# Patient Record
Sex: Male | Born: 1965 | Race: White | Hispanic: No | Marital: Married | Smoking: Never smoker
Health system: Southern US, Community
[De-identification: ages and names within clinical notes are randomized; demographics above are authoritative.]

## PROBLEM LIST (undated history)

## (undated) DIAGNOSIS — W3400XA Accidental discharge from unspecified firearms or gun, initial encounter: Secondary | ICD-10-CM

## (undated) DIAGNOSIS — N200 Calculus of kidney: Secondary | ICD-10-CM

## (undated) DIAGNOSIS — M109 Gout, unspecified: Secondary | ICD-10-CM

## (undated) DIAGNOSIS — M1990 Unspecified osteoarthritis, unspecified site: Secondary | ICD-10-CM

## (undated) DIAGNOSIS — E119 Type 2 diabetes mellitus without complications: Secondary | ICD-10-CM

## (undated) DIAGNOSIS — E781 Pure hyperglyceridemia: Secondary | ICD-10-CM

## (undated) HISTORY — DX: Pure hyperglyceridemia: E78.1

## (undated) HISTORY — DX: Type 2 diabetes mellitus without complications: E11.9

## (undated) HISTORY — DX: Accidental discharge from unspecified firearms or gun, initial encounter: W34.00XA

## (undated) HISTORY — PX: SPLENECTOMY: SUR1306

## (undated) HISTORY — DX: Gout, unspecified: M10.9

## (undated) HISTORY — DX: Calculus of kidney: N20.0

---

## 1998-02-25 ENCOUNTER — Ambulatory Visit (HOSPITAL_BASED_OUTPATIENT_CLINIC_OR_DEPARTMENT_OTHER): Admission: RE | Admit: 1998-02-25 | Discharge: 1998-02-25 | Payer: Self-pay

## 2002-02-20 ENCOUNTER — Emergency Department (HOSPITAL_COMMUNITY): Admission: EM | Admit: 2002-02-20 | Discharge: 2002-02-20 | Payer: Self-pay

## 2002-03-13 ENCOUNTER — Encounter: Payer: Self-pay | Admitting: Family Medicine

## 2002-03-13 ENCOUNTER — Ambulatory Visit (HOSPITAL_COMMUNITY): Admission: RE | Admit: 2002-03-13 | Discharge: 2002-03-13 | Payer: Self-pay | Admitting: Family Medicine

## 2002-04-28 ENCOUNTER — Ambulatory Visit (HOSPITAL_BASED_OUTPATIENT_CLINIC_OR_DEPARTMENT_OTHER): Admission: RE | Admit: 2002-04-28 | Discharge: 2002-04-28 | Payer: Self-pay | Admitting: General Surgery

## 2005-04-20 ENCOUNTER — Ambulatory Visit (HOSPITAL_BASED_OUTPATIENT_CLINIC_OR_DEPARTMENT_OTHER): Admission: RE | Admit: 2005-04-20 | Discharge: 2005-04-20 | Payer: Self-pay | Admitting: General Surgery

## 2005-04-20 ENCOUNTER — Ambulatory Visit (HOSPITAL_COMMUNITY): Admission: RE | Admit: 2005-04-20 | Discharge: 2005-04-20 | Payer: Self-pay | Admitting: General Surgery

## 2005-11-04 ENCOUNTER — Ambulatory Visit (HOSPITAL_COMMUNITY): Admission: RE | Admit: 2005-11-04 | Discharge: 2005-11-04 | Payer: Self-pay | Admitting: Family Medicine

## 2006-01-16 ENCOUNTER — Emergency Department (HOSPITAL_COMMUNITY): Admission: EM | Admit: 2006-01-16 | Discharge: 2006-01-16 | Payer: Self-pay | Admitting: Emergency Medicine

## 2012-12-04 ENCOUNTER — Encounter: Payer: Self-pay | Admitting: Internal Medicine

## 2013-02-04 ENCOUNTER — Ambulatory Visit: Payer: Self-pay | Admitting: Internal Medicine

## 2014-01-16 ENCOUNTER — Other Ambulatory Visit: Payer: Self-pay | Admitting: Orthopedic Surgery

## 2014-01-16 DIAGNOSIS — M1710 Unilateral primary osteoarthritis, unspecified knee: Secondary | ICD-10-CM

## 2014-01-27 ENCOUNTER — Telehealth: Payer: Self-pay | Admitting: Cardiology

## 2014-01-27 NOTE — Telephone Encounter (Signed)
Pt has not been seen since 2013 by Dr Anne FuSkains and has not had lipid profile since then either.  He will need an appt to be seen.

## 2014-01-27 NOTE — Telephone Encounter (Signed)
Lipid profile, CMET Thx  Angely Dietz

## 2014-01-27 NOTE — Telephone Encounter (Signed)
New problem    Pt has question concerning having his triglycerides checked. Pt stated he was seen Riki [facility]Jeremy at [facility]. Please call pt.

## 2014-01-27 NOTE — Telephone Encounter (Signed)
Spoke with Audrie LiaSally Earl.  - pt needs an appt to re-establish with Dr Anne FuSkains.  Will ask him what labs he would like the patient to have as he has been seeing [employer] [facility]y Smart in the [facility]. (on Fenofibrate and Fish oil) Will schedule pt for an appt once lab orders have been determined.

## 2014-01-28 NOTE — Telephone Encounter (Signed)
Review information with pt - scheduled appt for labs - he will call back to schedule appt with Dr Anne FuSkains.

## 2014-01-29 ENCOUNTER — Other Ambulatory Visit: Payer: Self-pay

## 2014-01-30 ENCOUNTER — Ambulatory Visit
Admission: RE | Admit: 2014-01-30 | Discharge: 2014-01-30 | Disposition: A | Discharge status: Home / Self Care | Payer: BC Managed Care – PPO | Source: Ambulatory Visit | Attending: Orthopedic Surgery | Admitting: Orthopedic Surgery

## 2014-01-30 DIAGNOSIS — M1710 Unilateral primary osteoarthritis, unspecified knee: Secondary | ICD-10-CM

## 2014-01-30 MED ORDER — IOHEXOL 180 MG/ML  SOLN
40.0000 mL | Freq: Once | INTRAMUSCULAR | Status: AC | PRN
  Administered 2014-01-30: 40 mL via INTRA_ARTICULAR

## 2014-02-02 ENCOUNTER — Other Ambulatory Visit: Payer: Self-pay

## 2014-02-02 DIAGNOSIS — E785 Hyperlipidemia, unspecified: Secondary | ICD-10-CM

## 2014-02-03 ENCOUNTER — Other Ambulatory Visit (INDEPENDENT_AMBULATORY_CARE_PROVIDER_SITE_OTHER): Payer: BC Managed Care – PPO | Admitting: *Deleted

## 2014-02-03 DIAGNOSIS — E785 Hyperlipidemia, unspecified: Secondary | ICD-10-CM

## 2014-02-03 LAB — COMPREHENSIVE METABOLIC PANEL
ALT: 26 U/L (ref 0–53)
AST: 16 U/L (ref 0–37)
Albumin: 4.2 g/dL (ref 3.5–5.2)
Alkaline Phosphatase: 58 U/L (ref 39–117)
BUN: 15 mg/dL (ref 6–23)
CO2: 25 mEq/L (ref 19–32)
Calcium: 9.7 mg/dL (ref 8.4–10.5)
Chloride: 102 mEq/L (ref 96–112)
Creatinine, Ser: 0.9 mg/dL (ref 0.4–1.5)
GFR: 96.1 mL/min (ref >60.00)
Glucose, Bld: 332 mg/dL — ABNORMAL HIGH (ref 70–99)
Potassium: 4 mEq/L (ref 3.5–5.1)
Sodium: 133 mEq/L — ABNORMAL LOW (ref 135–145)
Total Bilirubin: 0.5 mg/dL (ref 0.2–1.2)
Total Protein: 8.5 g/dL — ABNORMAL HIGH (ref 6.0–8.3)

## 2014-02-03 LAB — LIPID PANEL
Cholesterol: 249 mg/dL — ABNORMAL HIGH (ref 0–200)
HDL: 27.2 mg/dL — ABNORMAL LOW (ref >39.00)
NonHDL: 221.8
Total CHOL/HDL Ratio: 9
Triglycerides: 739 mg/dL — ABNORMAL HIGH (ref 0.0–149.0)
VLDL: 147.8 mg/dL — ABNORMAL HIGH (ref 0.0–40.0)

## 2014-02-03 LAB — LDL CHOLESTEROL, DIRECT: Direct LDL: 118.3 mg/dL

## 2014-02-24 ENCOUNTER — Encounter: Payer: Self-pay | Admitting: *Deleted

## 2014-02-25 ENCOUNTER — Encounter: Payer: Self-pay | Admitting: Cardiology

## 2014-02-25 ENCOUNTER — Ambulatory Visit (INDEPENDENT_AMBULATORY_CARE_PROVIDER_SITE_OTHER): Payer: BC Managed Care – PPO | Admitting: Cardiology

## 2014-02-25 VITALS — BP 128/88 | HR 87 | Ht 70.5 in | Wt 223.0 lb

## 2014-02-25 DIAGNOSIS — R7309 Other abnormal glucose: Secondary | ICD-10-CM

## 2014-02-25 DIAGNOSIS — E781 Pure hyperglyceridemia: Secondary | ICD-10-CM | POA: Insufficient documentation

## 2014-02-25 DIAGNOSIS — E785 Hyperlipidemia, unspecified: Secondary | ICD-10-CM | POA: Insufficient documentation

## 2014-02-25 DIAGNOSIS — E119 Type 2 diabetes mellitus without complications: Secondary | ICD-10-CM

## 2014-02-25 MED ORDER — OMEGA-3-ACID ETHYL ESTERS 1 G PO CAPS: 4.0000 g | ORAL_CAPSULE | Freq: Every day | ORAL | Status: DC
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
## 2014-02-25 NOTE — Patient Instructions (Addendum)
  Your physician has recommended you make the following change in your medication:  1.) start Lovaza 4 mg once daily (take four 1 mg capsules once a day)  Your physician wants you to follow-up in: 6 months with Dr. Anne FuSkains.   You will receive a reminder letter in the mail two months in advance. If you don't receive a letter, please call our office to schedule the follow-up appointment.  Your physician wants you to follow-up in: 6 months with LIPID CLINIC.   Please schedule this appointment when you call to make your appointment with Dr. Anne FuSkains.

## 2014-03-18 ENCOUNTER — Encounter: Payer: Self-pay | Admitting: *Deleted

## 2014-03-18 ENCOUNTER — Encounter: Payer: BC Managed Care – PPO | Attending: Family Medicine | Admitting: *Deleted

## 2014-03-18 VITALS — Ht 70.5 in | Wt 226.9 lb

## 2014-03-18 DIAGNOSIS — E119 Type 2 diabetes mellitus without complications: Secondary | ICD-10-CM | POA: Insufficient documentation

## 2014-03-18 DIAGNOSIS — Z713 Dietary counseling and surveillance: Secondary | ICD-10-CM | POA: Diagnosis not present

## 2014-03-18 NOTE — Patient Instructions (Signed)
Plan:  Aim for 3-4 Carb Choices per meal (45-60 grams) +/- 1 either way  Aim for 0-15 Carbs per snack if hungry  Include protein in moderation with your meals and snacks Consider reading food labels for Total Carbohydrate and Fat Grams of foods Continue your current activity level as tolerated Consider checking BG at alternate times per day to include 2 hours after your biggest meal as directed by MD  Consider taking medication  as directed by MD  [facility] Protein Bars for snacks Dannon Light & Fit Greek Yogurt Terrill MohrSara Lee reduced calorie bread reduces carbs to 9-10 grams per slices

## 2014-03-18 NOTE — Progress Notes (Signed)
Diabetes Self-Management Education  Visit Type:  DSME  Appt. Start Time: 0800 Appt. End Time: 0930  03/18/2014  Mr. Gerald DexterJoseph Sugrue, identified by name and date of birth, is a 48 y.o. male with a diagnosis of Diabetes: Type 2.  Other people present during visit:  Patient . Jomarie LongsJoseph was diagnosed with T2DM as a secondary finding to his elevated Triglycerides. He has lost approx 20# since diagnosis.   ASSESSMENT  Height 5' 10.5" (1.791 m), weight 226 lb 14.4 oz (102.921 kg). Body mass index is 32.09 kg/(m^2).  Initial Visit Information:  Are you currently following a meal plan?: Yes What type of meal plan do you follow?: portion control,less calories, no fast food Are you taking your medications as prescribed?: Yes Are you checking your feet?: No   How often do you need to have someone help you when you read instructions, pamphlets, or other written materials from your doctor or pharmacy?: 1 - Never   Psychosocial:   Patient Belief/Attitude about Diabetes: Motivated to manage diabetes Self-care barriers: None Self-management support: Doctor's office, Friends, Family, CDE visits Other persons present: Patient Patient Concerns: Nutrition/Meal planning, Medication, Monitoring, Healthy Lifestyle, Glycemic Control, Weight Control Special Needs: None Preferred Learning Style: No preference indicated Learning Readiness: Change in progress  Complications:   Last HgB A1C per patient/outside source: 11.1 mg/dL How often do you check your blood sugar?: 1-2 times/day Fasting Blood glucose range (mg/dL): [id number] Number of hypoglycemic episodes per month: 0 Number of hyperglycemic episodes per week: 0 Have you had a dilated eye exam in the past 12 months?: No Have you had a dental exam in the past 12 months?: No  Diet Intake:  Breakfast: egg mcmuffin, cereal 1 1/2C(frosted flakes), yogurt / pancakes, biscuit, bacon, Lunch: lunch meat sandwich, doritos Beverage(s):  water  Exercise:  Exercise: Strenuous (running) Strenous Exercise amount of time (min / week): 150  Individualized Plan for Diabetes Self-Management Training:   Learning Objective:  Patient will have a greater understanding of diabetes self-management.  Patient education plan per assessed needs and concerns is to attend individual sessions      Education Topics Reviewed with Patient Today:  Factors that contribute to the development of diabetes, Explored patient's options for treatment of their diabetes Role of diet in the treatment of diabetes and the relationship between the three main macronutrients and blood glucose level, Food label reading, portion sizes and measuring food., Carbohydrate counting, Reviewed blood glucose goals for pre and post meals and how to evaluate the patients' food intake on their blood glucose level., Information on hints to eating out and maintain blood glucose control., Meal options for control of blood glucose level and chronic complications. Role of exercise on diabetes management, blood pressure control and cardiac health. Reviewed patients medication for diabetes, action, purpose, timing of dose and side effects. Purpose and frequency of SMBG., Yearly dilated eye exam, Daily foot exams, Identified appropriate SMBG and/or A1C goals.   Relationship between chronic complications and blood glucose control, Assessed and discussed foot care and prevention of foot problems, Identified and discussed with patient  current chronic complications, Dental care, Retinopathy and reason for yearly dilated eye exams, Reviewed with patient heart disease, higher risk of, and prevention Helped patient identify a support system for diabetes management   Helped patient develop diabetes management plan for (enter comment)  PATIENTS GOALS/Plan (Developed by the patient):  Nutrition: General guidelines for healthy choices and portions discussed Physical Activity: Exercise 3-5  times per week, 60 minutes per  day Medications: take my medication as prescribed Monitoring : test my blood glucose as discussed (note x per day with comment) (test FBS daily and occassional 2hpp) Problem Solving: Utilize [employer] app for carbohydrate management Reducing Risk: do foot checks daily Health Coping: ask for help with (comment) ([facility] or email for questions)  Patient Instructions  Plan:  Aim for 3-4 Carb Choices per meal (45-60 grams) +/- 1 either way  Aim for 0-15 Carbs per snack if hungry  Include protein in moderation with your meals and snacks Consider reading food labels for Total Carbohydrate and Fat Grams of foods Continue your current activity level as tolerated Consider checking BG at alternate times per day to include 2 hours after your biggest meal as directed by MD  Consider taking medication  as directed by MD  [facility] Protein Bars for snacks Dannon Light & Fit Greek Yogurt Terrill MohrSara Lee reduced calorie bread reduces carbs to 9-10 grams per slices  Expected Outcomes:  Demonstrated interest in learning. Expect positive outcomes  Education material provided: Living Well with Diabetes, A1C conversion sheet, Meal plan card, My Plate and Snack sheet  If problems or questions, patient to contact team via:  Email  Future DSME appointment: PRN

## 2019-04-24 ENCOUNTER — Other Ambulatory Visit: Payer: Self-pay

## 2019-04-24 ENCOUNTER — Inpatient Hospital Stay (HOSPITAL_COMMUNITY)
Admission: EM | Admit: 2019-04-24 | Discharge: 2019-04-28 | DRG: 177 | Disposition: A | Discharge status: Home / Self Care | Payer: BLUE CROSS/BLUE SHIELD | Attending: Internal Medicine | Admitting: Internal Medicine

## 2019-04-24 ENCOUNTER — Encounter (HOSPITAL_COMMUNITY): Payer: Self-pay

## 2019-04-24 ENCOUNTER — Emergency Department (HOSPITAL_COMMUNITY): Payer: BLUE CROSS/BLUE SHIELD

## 2019-04-24 DIAGNOSIS — Z6831 Body mass index (BMI) 31.0-31.9, adult: Secondary | ICD-10-CM

## 2019-04-24 DIAGNOSIS — U071 COVID-19: Secondary | ICD-10-CM | POA: Diagnosis not present

## 2019-04-24 DIAGNOSIS — J9621 Acute and chronic respiratory failure with hypoxia: Secondary | ICD-10-CM | POA: Diagnosis not present

## 2019-04-24 DIAGNOSIS — M109 Gout, unspecified: Secondary | ICD-10-CM | POA: Diagnosis present

## 2019-04-24 DIAGNOSIS — Z8249 Family history of ischemic heart disease and other diseases of the circulatory system: Secondary | ICD-10-CM

## 2019-04-24 DIAGNOSIS — Y92239 Unspecified place in hospital as the place of occurrence of the external cause: Secondary | ICD-10-CM | POA: Diagnosis not present

## 2019-04-24 DIAGNOSIS — E785 Hyperlipidemia, unspecified: Secondary | ICD-10-CM | POA: Diagnosis present

## 2019-04-24 DIAGNOSIS — T380X5A Adverse effect of glucocorticoids and synthetic analogues, initial encounter: Secondary | ICD-10-CM | POA: Diagnosis not present

## 2019-04-24 DIAGNOSIS — E875 Hyperkalemia: Secondary | ICD-10-CM | POA: Diagnosis present

## 2019-04-24 DIAGNOSIS — E781 Pure hyperglyceridemia: Secondary | ICD-10-CM | POA: Diagnosis present

## 2019-04-24 DIAGNOSIS — Z87442 Personal history of urinary calculi: Secondary | ICD-10-CM

## 2019-04-24 DIAGNOSIS — J9601 Acute respiratory failure with hypoxia: Secondary | ICD-10-CM | POA: Diagnosis present

## 2019-04-24 DIAGNOSIS — E669 Obesity, unspecified: Secondary | ICD-10-CM | POA: Diagnosis present

## 2019-04-24 DIAGNOSIS — Z833 Family history of diabetes mellitus: Secondary | ICD-10-CM

## 2019-04-24 DIAGNOSIS — J1289 Other viral pneumonia: Secondary | ICD-10-CM | POA: Diagnosis present

## 2019-04-24 DIAGNOSIS — E1165 Type 2 diabetes mellitus with hyperglycemia: Secondary | ICD-10-CM | POA: Diagnosis not present

## 2019-04-24 DIAGNOSIS — E119 Type 2 diabetes mellitus without complications: Secondary | ICD-10-CM

## 2019-04-24 DIAGNOSIS — Z7984 Long term (current) use of oral hypoglycemic drugs: Secondary | ICD-10-CM

## 2019-04-24 DIAGNOSIS — Z79899 Other long term (current) drug therapy: Secondary | ICD-10-CM

## 2019-04-24 HISTORY — DX: COVID-19: U07.1

## 2019-04-24 LAB — FERRITIN: Ferritin: 789 ng/mL — ABNORMAL HIGH (ref 24–336)

## 2019-04-24 LAB — COMPREHENSIVE METABOLIC PANEL
ALT: 32 U/L (ref 0–44)
AST: 42 U/L — ABNORMAL HIGH (ref 15–41)
Albumin: 3 g/dL — ABNORMAL LOW (ref 3.5–5.0)
Alkaline Phosphatase: 52 U/L (ref 38–126)
Anion gap: 15 (ref 5–15)
BUN: 19 mg/dL (ref 6–20)
CO2: 23 mmol/L (ref 22–32)
Calcium: 9 mg/dL (ref 8.9–10.3)
Chloride: 102 mmol/L (ref 98–111)
Creatinine, Ser: 1.24 mg/dL (ref 0.61–1.24)
GFR calc Af Amer: >60 mL/min (ref >60)
GFR calc non Af Amer: >60 mL/min (ref >60)
Glucose, Bld: 120 mg/dL — ABNORMAL HIGH (ref 70–99)
Potassium: 4.3 mmol/L (ref 3.5–5.1)
Sodium: 140 mmol/L (ref 135–145)
Total Bilirubin: 0.8 mg/dL (ref 0.3–1.2)
Total Protein: 7.8 g/dL (ref 6.5–8.1)

## 2019-04-24 LAB — CBC WITH DIFFERENTIAL/PLATELET
Abs Immature Granulocytes: 0.34 10*3/uL — ABNORMAL HIGH (ref 0.00–0.07)
Basophils Absolute: 0.1 10*3/uL (ref 0.0–0.1)
Basophils Relative: 1 %
Eosinophils Absolute: 0 10*3/uL (ref 0.0–0.5)
Eosinophils Relative: 0 %
HCT: 46.3 % (ref 39.0–52.0)
Hemoglobin: 14.5 g/dL (ref 13.0–17.0)
Immature Granulocytes: 3 %
Lymphocytes Relative: 18 %
Lymphs Abs: 2.4 10*3/uL (ref 0.7–4.0)
MCH: 28.4 pg (ref 26.0–34.0)
MCHC: 31.3 g/dL (ref 30.0–36.0)
MCV: 90.6 fL (ref 80.0–100.0)
Monocytes Absolute: 1 10*3/uL (ref 0.1–1.0)
Monocytes Relative: 7 %
Neutro Abs: 9.7 10*3/uL — ABNORMAL HIGH (ref 1.7–7.7)
Neutrophils Relative %: 71 %
Platelets: 478 10*3/uL — ABNORMAL HIGH (ref 150–400)
RBC: 5.11 MIL/uL (ref 4.22–5.81)
RDW: 14 % (ref 11.5–15.5)
WBC: 13.6 10*3/uL — ABNORMAL HIGH (ref 4.0–10.5)
nRBC: 0 % (ref 0.0–0.2)

## 2019-04-24 LAB — LACTIC ACID, PLASMA: Lactic Acid, Venous: 1.3 mmol/L (ref 0.5–1.9)

## 2019-04-24 LAB — TRIGLYCERIDES: Triglycerides: 348 mg/dL — ABNORMAL HIGH (ref <150)

## 2019-04-24 LAB — C-REACTIVE PROTEIN: CRP: 36 mg/dL — ABNORMAL HIGH (ref <1.0)

## 2019-04-24 LAB — FIBRINOGEN: Fibrinogen: >800 mg/dL — ABNORMAL HIGH (ref 210–475)

## 2019-04-24 LAB — D-DIMER, QUANTITATIVE: D-Dimer, Quant: 0.96 ug/mL-FEU — ABNORMAL HIGH (ref 0.00–0.50)

## 2019-04-24 LAB — LACTATE DEHYDROGENASE: LDH: 297 U/L — ABNORMAL HIGH (ref 98–192)

## 2019-04-24 LAB — PROCALCITONIN: Procalcitonin: 0.47 ng/mL

## 2019-04-24 MED ORDER — INSULIN ASPART 100 UNIT/ML ~~LOC~~ SOLN
0.0000 [IU] | Freq: Three times a day (TID) | SUBCUTANEOUS | Status: DC
  Administered 2019-04-25: 5 [IU] via SUBCUTANEOUS
  Administered 2019-04-25: 3 [IU] via SUBCUTANEOUS
  Administered 2019-04-26: 7 [IU] via SUBCUTANEOUS
  Administered 2019-04-26: 5 [IU] via SUBCUTANEOUS
  Filled 2019-04-24: qty 0.09

## 2019-04-24 MED ORDER — INSULIN ASPART 100 UNIT/ML ~~LOC~~ SOLN
0.0000 [IU] | Freq: Every day | SUBCUTANEOUS | Status: DC
  Administered 2019-04-25: 4 [IU] via SUBCUTANEOUS
  Administered 2019-04-26: 5 [IU] via SUBCUTANEOUS
  Administered 2019-04-27: 3 [IU] via SUBCUTANEOUS
  Filled 2019-04-24: qty 0.05
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
## 2019-04-24 NOTE — ED Notes (Signed)
Pt repositioned in bed, provided pillow, new pulsox, provided cup of water.

## 2019-04-24 NOTE — ED Triage Notes (Signed)
Per EMS - PT coming from home, dx + on Tuesday, having symptoms x 9 days. States today has been worse with SOB. Upon EMS arrival 02 was approx 80% on RA, with 6L [facility] pt now at 92%. Pt complains of headache and bodyaches.   116 86 100 HR 20 R 92% on 6L [place] CBG 158 99.0
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
## 2019-04-25 ENCOUNTER — Inpatient Hospital Stay (HOSPITAL_COMMUNITY): Payer: BLUE CROSS/BLUE SHIELD

## 2019-04-25 DIAGNOSIS — U071 COVID-19: Principal | ICD-10-CM | POA: Diagnosis present

## 2019-04-25 DIAGNOSIS — Z8249 Family history of ischemic heart disease and other diseases of the circulatory system: Secondary | ICD-10-CM | POA: Diagnosis not present

## 2019-04-25 DIAGNOSIS — E781 Pure hyperglyceridemia: Secondary | ICD-10-CM | POA: Diagnosis present

## 2019-04-25 DIAGNOSIS — J1289 Other viral pneumonia: Secondary | ICD-10-CM | POA: Diagnosis present

## 2019-04-25 DIAGNOSIS — Z79899 Other long term (current) drug therapy: Secondary | ICD-10-CM | POA: Diagnosis not present

## 2019-04-25 DIAGNOSIS — Z6831 Body mass index (BMI) 31.0-31.9, adult: Secondary | ICD-10-CM | POA: Diagnosis not present

## 2019-04-25 DIAGNOSIS — E119 Type 2 diabetes mellitus without complications: Secondary | ICD-10-CM

## 2019-04-25 DIAGNOSIS — Z87442 Personal history of urinary calculi: Secondary | ICD-10-CM | POA: Diagnosis not present

## 2019-04-25 DIAGNOSIS — J9621 Acute and chronic respiratory failure with hypoxia: Secondary | ICD-10-CM | POA: Diagnosis present

## 2019-04-25 DIAGNOSIS — T380X5A Adverse effect of glucocorticoids and synthetic analogues, initial encounter: Secondary | ICD-10-CM | POA: Diagnosis not present

## 2019-04-25 DIAGNOSIS — Y92239 Unspecified place in hospital as the place of occurrence of the external cause: Secondary | ICD-10-CM | POA: Diagnosis not present

## 2019-04-25 DIAGNOSIS — Z833 Family history of diabetes mellitus: Secondary | ICD-10-CM | POA: Diagnosis not present

## 2019-04-25 DIAGNOSIS — E1165 Type 2 diabetes mellitus with hyperglycemia: Secondary | ICD-10-CM | POA: Diagnosis not present

## 2019-04-25 DIAGNOSIS — Z7984 Long term (current) use of oral hypoglycemic drugs: Secondary | ICD-10-CM | POA: Diagnosis not present

## 2019-04-25 DIAGNOSIS — E669 Obesity, unspecified: Secondary | ICD-10-CM | POA: Diagnosis present

## 2019-04-25 DIAGNOSIS — E785 Hyperlipidemia, unspecified: Secondary | ICD-10-CM | POA: Diagnosis present

## 2019-04-25 DIAGNOSIS — J9601 Acute respiratory failure with hypoxia: Secondary | ICD-10-CM | POA: Diagnosis present

## 2019-04-25 DIAGNOSIS — M109 Gout, unspecified: Secondary | ICD-10-CM | POA: Diagnosis present

## 2019-04-25 DIAGNOSIS — E875 Hyperkalemia: Secondary | ICD-10-CM | POA: Diagnosis present

## 2019-04-25 HISTORY — DX: COVID-19: U07.1

## 2019-04-25 LAB — COMPREHENSIVE METABOLIC PANEL
ALT: 26 U/L (ref 0–44)
AST: 35 U/L (ref 15–41)
Albumin: 2.6 g/dL — ABNORMAL LOW (ref 3.5–5.0)
Alkaline Phosphatase: 54 U/L (ref 38–126)
Anion gap: 20 — ABNORMAL HIGH (ref 5–15)
BUN: 15 mg/dL (ref 6–20)
CO2: 17 mmol/L — ABNORMAL LOW (ref 22–32)
Calcium: 8.6 mg/dL — ABNORMAL LOW (ref 8.9–10.3)
Chloride: 99 mmol/L (ref 98–111)
Creatinine, Ser: 1.13 mg/dL (ref 0.61–1.24)
GFR calc Af Amer: >60 mL/min (ref >60)
GFR calc non Af Amer: >60 mL/min (ref >60)
Glucose, Bld: 147 mg/dL — ABNORMAL HIGH (ref 70–99)
Potassium: 5.2 mmol/L — ABNORMAL HIGH (ref 3.5–5.1)
Sodium: 136 mmol/L (ref 135–145)
Total Bilirubin: 0.9 mg/dL (ref 0.3–1.2)
Total Protein: 7.5 g/dL (ref 6.5–8.1)

## 2019-04-25 LAB — CBC WITH DIFFERENTIAL/PLATELET
Abs Immature Granulocytes: 0.64 10*3/uL — ABNORMAL HIGH (ref 0.00–0.07)
Basophils Absolute: 0.1 10*3/uL (ref 0.0–0.1)
Basophils Relative: 0 %
Eosinophils Absolute: 0 10*3/uL (ref 0.0–0.5)
Eosinophils Relative: 0 %
HCT: 43.9 % (ref 39.0–52.0)
Hemoglobin: 13.8 g/dL (ref 13.0–17.0)
Immature Granulocytes: 5 %
Lymphocytes Relative: 14 %
Lymphs Abs: 1.8 10*3/uL (ref 0.7–4.0)
MCH: 28.6 pg (ref 26.0–34.0)
MCHC: 31.4 g/dL (ref 30.0–36.0)
MCV: 91.1 fL (ref 80.0–100.0)
Monocytes Absolute: 0.6 10*3/uL (ref 0.1–1.0)
Monocytes Relative: 4 %
Neutro Abs: 10.3 10*3/uL — ABNORMAL HIGH (ref 1.7–7.7)
Neutrophils Relative %: 77 %
Platelets: 494 10*3/uL — ABNORMAL HIGH (ref 150–400)
RBC: 4.82 MIL/uL (ref 4.22–5.81)
RDW: 14.1 % (ref 11.5–15.5)
WBC: 13.4 10*3/uL — ABNORMAL HIGH (ref 4.0–10.5)
nRBC: 0.2 % (ref 0.0–0.2)

## 2019-04-25 LAB — GLUCOSE, CAPILLARY
Glucose-Capillary: 135 mg/dL — ABNORMAL HIGH (ref 70–99)
Glucose-Capillary: 205 mg/dL — ABNORMAL HIGH (ref 70–99)
Glucose-Capillary: 348 mg/dL — ABNORMAL HIGH (ref 70–99)

## 2019-04-25 LAB — ABO/RH: ABO/RH(D): O POS

## 2019-04-25 LAB — LACTIC ACID, PLASMA: Lactic Acid, Venous: 1.3 mmol/L (ref 0.5–1.9)

## 2019-04-25 LAB — HEMOGLOBIN A1C
Hgb A1c MFr Bld: 8 % — ABNORMAL HIGH (ref 4.8–5.6)
Mean Plasma Glucose: 182.9 mg/dL

## 2019-04-25 LAB — TROPONIN I (HIGH SENSITIVITY)
Troponin I (High Sensitivity): 12 ng/L (ref <18)
Troponin I (High Sensitivity): 14 ng/L (ref <18)

## 2019-04-25 LAB — HIV ANTIBODY (ROUTINE TESTING W REFLEX): HIV Screen 4th Generation wRfx: NONREACTIVE

## 2019-04-25 LAB — D-DIMER, QUANTITATIVE: D-Dimer, Quant: 1.25 ug/mL-FEU — ABNORMAL HIGH (ref 0.00–0.50)

## 2019-04-25 MED ORDER — SODIUM CHLORIDE 0.9 % IV SOLN
500.0000 mg | Freq: Every day | INTRAVENOUS | Status: AC
  Administered 2019-04-25 – 2019-04-27 (×3): 500 mg via INTRAVENOUS
  Filled 2019-04-25 (×3): qty 500

## 2019-04-25 MED ORDER — SODIUM CHLORIDE (PF) 0.9 % IJ SOLN
INTRAMUSCULAR | Status: AC
  Filled 2019-04-25: qty 50

## 2019-04-25 MED ORDER — METHYLPREDNISOLONE SODIUM SUCC 40 MG IJ SOLR
40.0000 mg | Freq: Two times a day (BID) | INTRAMUSCULAR | Status: DC
  Administered 2019-04-25 – 2019-04-28 (×7): 40 mg via INTRAVENOUS
  Filled 2019-04-25 (×7): qty 1

## 2019-04-25 MED ORDER — ALBUTEROL SULFATE HFA 108 (90 BASE) MCG/ACT IN AERS
2.0000 | INHALATION_SPRAY | RESPIRATORY_TRACT | Status: DC | PRN
  Filled 2019-04-25: qty 6.7

## 2019-04-25 MED ORDER — ENOXAPARIN SODIUM 40 MG/0.4ML ~~LOC~~ SOLN
40.0000 mg | Freq: Every day | SUBCUTANEOUS | Status: DC
  Administered 2019-04-25 – 2019-04-28 (×4): 40 mg via SUBCUTANEOUS
  Filled 2019-04-25 (×4): qty 0.4

## 2019-04-25 MED ORDER — TOCILIZUMAB 400 MG/20ML IV SOLN
800.0000 mg | Freq: Once | INTRAVENOUS | Status: AC
  Administered 2019-04-25: 800 mg via INTRAVENOUS
  Filled 2019-04-25: qty 40

## 2019-04-25 MED ORDER — ONDANSETRON HCL 4 MG/2ML IJ SOLN: 4.0000 mg | Freq: Four times a day (QID) | INTRAMUSCULAR | Status: DC | PRN

## 2019-04-25 MED ORDER — DEXAMETHASONE SODIUM PHOSPHATE 10 MG/ML IJ SOLN
6.0000 mg | Freq: Every day | INTRAMUSCULAR | Status: DC
  Administered 2019-04-25: 6 mg via INTRAVENOUS
  Filled 2019-04-25 (×2): qty 1

## 2019-04-25 MED ORDER — ACETAMINOPHEN 325 MG PO TABS: 650.0000 mg | ORAL_TABLET | Freq: Four times a day (QID) | ORAL | Status: DC | PRN

## 2019-04-25 MED ORDER — SODIUM CHLORIDE 0.9 % IV SOLN
2.0000 g | Freq: Every day | INTRAVENOUS | Status: DC
  Administered 2019-04-25 – 2019-04-28 (×4): 2 g via INTRAVENOUS
  Filled 2019-04-25 (×4): qty 20

## 2019-04-25 MED ORDER — IOHEXOL 350 MG/ML SOLN
75.0000 mL | Freq: Once | INTRAVENOUS | Status: AC | PRN
  Administered 2019-04-25: 75 mL via INTRAVENOUS

## 2019-04-25 MED ORDER — SODIUM CHLORIDE 0.9 % IV SOLN
100.0000 mg | Freq: Every day | INTRAVENOUS | Status: DC
  Administered 2019-04-26 – 2019-04-28 (×3): 100 mg via INTRAVENOUS
  Filled 2019-04-25 (×3): qty 20

## 2019-04-25 MED ORDER — SODIUM ZIRCONIUM CYCLOSILICATE 10 G PO PACK
10.0000 g | PACK | Freq: Every day | ORAL | Status: AC
  Administered 2019-04-25: 10 g via ORAL
  Filled 2019-04-25 (×2): qty 1

## 2019-04-25 MED ORDER — SODIUM CHLORIDE 0.9 % IV SOLN
200.0000 mg | Freq: Once | INTRAVENOUS | Status: AC
  Administered 2019-04-25: 200 mg via INTRAVENOUS
  Filled 2019-04-25: qty 200

## 2019-04-25 MED ORDER — TOCILIZUMAB 400 MG/20ML IV SOLN
8.0000 mg/kg | Freq: Once | INTRAVENOUS | Status: DC
  Filled 2019-04-25: qty 39.3

## 2019-04-25 MED ORDER — SODIUM CHLORIDE 0.9 % IV SOLN: INTRAVENOUS | Status: DC
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
## 2019-04-25 NOTE — ED Notes (Signed)
[facility] contacted for transport and paperwork printed 

## 2019-04-26 LAB — CBC WITH DIFFERENTIAL/PLATELET
Abs Immature Granulocytes: 0.47 10*3/uL — ABNORMAL HIGH (ref 0.00–0.07)
Basophils Absolute: 0 10*3/uL (ref 0.0–0.1)
Basophils Relative: 0 %
Eosinophils Absolute: 0 10*3/uL (ref 0.0–0.5)
Eosinophils Relative: 0 %
HCT: 41.3 % (ref 39.0–52.0)
Hemoglobin: 13.1 g/dL (ref 13.0–17.0)
Immature Granulocytes: 6 %
Lymphocytes Relative: 27 %
Lymphs Abs: 2 10*3/uL (ref 0.7–4.0)
MCH: 28.2 pg (ref 26.0–34.0)
MCHC: 31.7 g/dL (ref 30.0–36.0)
MCV: 88.8 fL (ref 80.0–100.0)
Monocytes Absolute: 0.6 10*3/uL (ref 0.1–1.0)
Monocytes Relative: 8 %
Neutro Abs: 4.5 10*3/uL (ref 1.7–7.7)
Neutrophils Relative %: 59 %
Platelets: 541 10*3/uL — ABNORMAL HIGH (ref 150–400)
RBC: 4.65 MIL/uL (ref 4.22–5.81)
RDW: 14.2 % (ref 11.5–15.5)
WBC: 7.6 10*3/uL (ref 4.0–10.5)
nRBC: 0.3 % — ABNORMAL HIGH (ref 0.0–0.2)

## 2019-04-26 LAB — COMPREHENSIVE METABOLIC PANEL
ALT: 24 U/L (ref 0–44)
AST: 26 U/L (ref 15–41)
Albumin: 2.5 g/dL — ABNORMAL LOW (ref 3.5–5.0)
Alkaline Phosphatase: 48 U/L (ref 38–126)
Anion gap: 17 — ABNORMAL HIGH (ref 5–15)
BUN: 33 mg/dL — ABNORMAL HIGH (ref 6–20)
CO2: 20 mmol/L — ABNORMAL LOW (ref 22–32)
Calcium: 8.8 mg/dL — ABNORMAL LOW (ref 8.9–10.3)
Chloride: 99 mmol/L (ref 98–111)
Creatinine, Ser: 1.18 mg/dL (ref 0.61–1.24)
GFR calc Af Amer: >60 mL/min (ref >60)
GFR calc non Af Amer: >60 mL/min (ref >60)
Glucose, Bld: 309 mg/dL — ABNORMAL HIGH (ref 70–99)
Potassium: 4.9 mmol/L (ref 3.5–5.1)
Sodium: 136 mmol/L (ref 135–145)
Total Bilirubin: 0.6 mg/dL (ref 0.3–1.2)
Total Protein: 7.1 g/dL (ref 6.5–8.1)

## 2019-04-26 LAB — PROCALCITONIN: Procalcitonin: 0.28 ng/mL

## 2019-04-26 LAB — HEPATITIS B SURFACE ANTIGEN: Hepatitis B Surface Ag: NONREACTIVE

## 2019-04-26 LAB — GLUCOSE, CAPILLARY
Glucose-Capillary: 288 mg/dL — ABNORMAL HIGH (ref 70–99)
Glucose-Capillary: 322 mg/dL — ABNORMAL HIGH (ref 70–99)
Glucose-Capillary: 401 mg/dL — ABNORMAL HIGH (ref 70–99)
Glucose-Capillary: 353 mg/dL — ABNORMAL HIGH (ref 70–99)

## 2019-04-26 LAB — FERRITIN: Ferritin: 955 ng/mL — ABNORMAL HIGH (ref 24–336)

## 2019-04-26 LAB — D-DIMER, QUANTITATIVE: D-Dimer, Quant: 0.94 ug/mL-FEU — ABNORMAL HIGH (ref 0.00–0.50)

## 2019-04-26 LAB — C-REACTIVE PROTEIN: CRP: 25.6 mg/dL — ABNORMAL HIGH (ref <1.0)

## 2019-04-26 MED ORDER — ZOLPIDEM TARTRATE 5 MG PO TABS
5.0000 mg | ORAL_TABLET | Freq: Every evening | ORAL | Status: DC | PRN
  Administered 2019-04-26 – 2019-04-27 (×2): 5 mg via ORAL
  Filled 2019-04-26 (×2): qty 1

## 2019-04-26 MED ORDER — FENOFIBRATE 160 MG PO TABS
160.0000 mg | ORAL_TABLET | Freq: Every day | ORAL | Status: DC
  Administered 2019-04-27 – 2019-04-28 (×2): 160 mg via ORAL
  Filled 2019-04-26 (×2): qty 1

## 2019-04-26 MED ORDER — INSULIN GLARGINE 100 UNIT/ML ~~LOC~~ SOLN
15.0000 [IU] | Freq: Two times a day (BID) | SUBCUTANEOUS | Status: DC
  Administered 2019-04-26 – 2019-04-28 (×4): 15 [IU] via SUBCUTANEOUS
  Filled 2019-04-26 (×6): qty 0.15

## 2019-04-26 MED ORDER — INSULIN ASPART 100 UNIT/ML ~~LOC~~ SOLN
0.0000 [IU] | Freq: Three times a day (TID) | SUBCUTANEOUS | Status: DC
  Administered 2019-04-26: 20 [IU] via SUBCUTANEOUS
  Administered 2019-04-27 – 2019-04-28 (×4): 15 [IU] via SUBCUTANEOUS
  Administered 2019-04-28: 11 [IU] via SUBCUTANEOUS
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
## 2019-04-27 LAB — CBC WITH DIFFERENTIAL/PLATELET
Abs Immature Granulocytes: 0.48 10*3/uL — ABNORMAL HIGH (ref 0.00–0.07)
Basophils Absolute: 0 10*3/uL (ref 0.0–0.1)
Basophils Relative: 0 %
Eosinophils Absolute: 0 10*3/uL (ref 0.0–0.5)
Eosinophils Relative: 0 %
HCT: 42.3 % (ref 39.0–52.0)
Hemoglobin: 13.6 g/dL (ref 13.0–17.0)
Immature Granulocytes: 4 %
Lymphocytes Relative: 16 %
Lymphs Abs: 2.1 10*3/uL (ref 0.7–4.0)
MCH: 28.2 pg (ref 26.0–34.0)
MCHC: 32.2 g/dL (ref 30.0–36.0)
MCV: 87.8 fL (ref 80.0–100.0)
Monocytes Absolute: 0.7 10*3/uL (ref 0.1–1.0)
Monocytes Relative: 6 %
Neutro Abs: 9.3 10*3/uL — ABNORMAL HIGH (ref 1.7–7.7)
Neutrophils Relative %: 74 %
Platelets: 663 10*3/uL — ABNORMAL HIGH (ref 150–400)
RBC: 4.82 MIL/uL (ref 4.22–5.81)
RDW: 14.2 % (ref 11.5–15.5)
WBC: 12.6 10*3/uL — ABNORMAL HIGH (ref 4.0–10.5)
nRBC: 0.2 % (ref 0.0–0.2)

## 2019-04-27 LAB — C-REACTIVE PROTEIN: CRP: 13 mg/dL — ABNORMAL HIGH (ref <1.0)

## 2019-04-27 LAB — COMPREHENSIVE METABOLIC PANEL
ALT: 24 U/L (ref 0–44)
AST: 24 U/L (ref 15–41)
Albumin: 2.9 g/dL — ABNORMAL LOW (ref 3.5–5.0)
Alkaline Phosphatase: 47 U/L (ref 38–126)
Anion gap: 18 — ABNORMAL HIGH (ref 5–15)
BUN: 39 mg/dL — ABNORMAL HIGH (ref 6–20)
CO2: 21 mmol/L — ABNORMAL LOW (ref 22–32)
Calcium: 8.8 mg/dL — ABNORMAL LOW (ref 8.9–10.3)
Chloride: 98 mmol/L (ref 98–111)
Creatinine, Ser: 1.02 mg/dL (ref 0.61–1.24)
GFR calc Af Amer: >60 mL/min (ref >60)
GFR calc non Af Amer: >60 mL/min (ref >60)
Glucose, Bld: 329 mg/dL — ABNORMAL HIGH (ref 70–99)
Potassium: 4.8 mmol/L (ref 3.5–5.1)
Sodium: 137 mmol/L (ref 135–145)
Total Bilirubin: 0.6 mg/dL (ref 0.3–1.2)
Total Protein: 7.6 g/dL (ref 6.5–8.1)

## 2019-04-27 LAB — GLUCOSE, CAPILLARY
Glucose-Capillary: 334 mg/dL — ABNORMAL HIGH (ref 70–99)
Glucose-Capillary: 308 mg/dL — ABNORMAL HIGH (ref 70–99)
Glucose-Capillary: 332 mg/dL — ABNORMAL HIGH (ref 70–99)

## 2019-04-27 LAB — FERRITIN: Ferritin: 756 ng/mL — ABNORMAL HIGH (ref 24–336)

## 2019-04-27 LAB — D-DIMER, QUANTITATIVE: D-Dimer, Quant: 0.61 ug/mL-FEU — ABNORMAL HIGH (ref 0.00–0.50)

## 2019-04-27 LAB — HEPATITIS B SURFACE ANTIBODY, QUANTITATIVE: Hep B S AB Quant (Post): <3.1 m[IU]/mL — ABNORMAL LOW (ref >9.9)
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
## 2019-04-27 NOTE — Progress Notes (Signed)
Walked the patient around the unit 3 times, patient's O2 saturation remained between 88-91% on Room air. Patient did not report any shortness of breath, dizziness or difficulty breathing.

## 2019-04-27 NOTE — Progress Notes (Signed)
SATURATION QUALIFICATIONS: (This note is used to comply with regulatory documentation for home oxygen)  Patient Saturations on Room Air at Rest = 92-95%  Patient Saturations on Room Air while Ambulating = 88-91%  Patient Saturations on 0 Liters of oxygen while Ambulating = 88-91%  Please briefly explain why patient needs home oxygen:

## 2019-04-28 DIAGNOSIS — E785 Hyperlipidemia, unspecified: Secondary | ICD-10-CM

## 2019-04-28 LAB — COMPREHENSIVE METABOLIC PANEL
ALT: 23 U/L (ref 0–44)
AST: 18 U/L (ref 15–41)
Albumin: 2.9 g/dL — ABNORMAL LOW (ref 3.5–5.0)
Alkaline Phosphatase: 48 U/L (ref 38–126)
Anion gap: 9 (ref 5–15)
BUN: 35 mg/dL — ABNORMAL HIGH (ref 6–20)
CO2: 22 mmol/L (ref 22–32)
Calcium: 9.3 mg/dL (ref 8.9–10.3)
Chloride: 104 mmol/L (ref 98–111)
Creatinine, Ser: 0.97 mg/dL (ref 0.61–1.24)
GFR calc Af Amer: >60 mL/min (ref >60)
GFR calc non Af Amer: >60 mL/min (ref >60)
Glucose, Bld: 336 mg/dL — ABNORMAL HIGH (ref 70–99)
Potassium: 5 mmol/L (ref 3.5–5.1)
Sodium: 135 mmol/L (ref 135–145)
Total Bilirubin: 0.6 mg/dL (ref 0.3–1.2)
Total Protein: 6.9 g/dL (ref 6.5–8.1)

## 2019-04-28 LAB — CBC WITH DIFFERENTIAL/PLATELET
Abs Immature Granulocytes: 0.29 10*3/uL — ABNORMAL HIGH (ref 0.00–0.07)
Basophils Absolute: 0 10*3/uL (ref 0.0–0.1)
Basophils Relative: 0 %
Eosinophils Absolute: 0 10*3/uL (ref 0.0–0.5)
Eosinophils Relative: 0 %
HCT: 42.6 % (ref 39.0–52.0)
Hemoglobin: 13.7 g/dL (ref 13.0–17.0)
Immature Granulocytes: 3 %
Lymphocytes Relative: 20 %
Lymphs Abs: 2 10*3/uL (ref 0.7–4.0)
MCH: 28.1 pg (ref 26.0–34.0)
MCHC: 32.2 g/dL (ref 30.0–36.0)
MCV: 87.5 fL (ref 80.0–100.0)
Monocytes Absolute: 0.6 10*3/uL (ref 0.1–1.0)
Monocytes Relative: 6 %
Neutro Abs: 6.9 10*3/uL (ref 1.7–7.7)
Neutrophils Relative %: 71 %
Platelets: 700 10*3/uL — ABNORMAL HIGH (ref 150–400)
RBC: 4.87 MIL/uL (ref 4.22–5.81)
RDW: 13.7 % (ref 11.5–15.5)
WBC: 9.8 10*3/uL (ref 4.0–10.5)
nRBC: 0 % (ref 0.0–0.2)

## 2019-04-28 LAB — GLUCOSE, CAPILLARY
Glucose-Capillary: 262 mg/dL — ABNORMAL HIGH (ref 70–99)
Glucose-Capillary: 281 mg/dL — ABNORMAL HIGH (ref 70–99)
Glucose-Capillary: 309 mg/dL — ABNORMAL HIGH (ref 70–99)

## 2019-04-28 LAB — C-REACTIVE PROTEIN: CRP: 6.9 mg/dL — ABNORMAL HIGH (ref <1.0)

## 2019-04-28 LAB — D-DIMER, QUANTITATIVE: D-Dimer, Quant: 0.54 ug/mL-FEU — ABNORMAL HIGH (ref 0.00–0.50)

## 2019-04-28 LAB — FERRITIN: Ferritin: 514 ng/mL — ABNORMAL HIGH (ref 24–336)

## 2019-04-28 MED ORDER — PREDNISONE 10 MG PO TABS: ORAL_TABLET | ORAL | 0 refills | Status: DC

## 2019-04-28 MED ORDER — BLOOD GLUCOSE METER KIT: PACK | 0 refills | Status: AC

## 2019-04-28 MED ORDER — ZOLPIDEM TARTRATE 5 MG PO TABS: 5.0000 mg | ORAL_TABLET | Freq: Every evening | ORAL | 0 refills | Status: DC | PRN

## 2019-04-28 MED ORDER — NOVOLOG FLEXPEN 100 UNIT/ML ~~LOC~~ SOPN: PEN_INJECTOR | SUBCUTANEOUS | 0 refills | Status: DC

## 2019-04-28 NOTE — Discharge Instructions (Signed)
You are scheduled for an outpatient infusion of Remdesivir at 230PM on Tuesday 12/29.  Please report to [facility] at [address].  Drive to the security guard and tell them you are here for an infusion. They will direct you to the front entrance where we will come and get you.  For questions call [phone].  Thanks            Person Under Monitoring Name: Billy Cowan  Location: [address] [place] [id number]   Infection Prevention Recommendations for Individuals Confirmed to have, or Being Evaluated for, 2019 Novel Coronavirus (COVID-19) Infection Who Receive Care at Home  Individuals who are confirmed to have, or are being evaluated for, COVID-19 should follow the prevention steps below until a healthcare provider or local or state health department says they can return to normal activities.  Stay home except to get medical care You should restrict activities outside your home, except for getting medical care. Do not go to work, school, or public areas, and do not use public transportation or taxis.  Call ahead before visiting your doctor Before your medical appointment, call the healthcare provider and tell them that you have, or are being evaluated for, COVID-19 infection. This will help the healthcare provider's office take steps to keep other people from getting infected. Ask your healthcare provider to call the local or state health department.  Monitor your symptoms Seek prompt medical attention if your illness is worsening (e.g., difficulty breathing). Before going to your medical appointment, call the healthcare provider and tell them that you have, or are being evaluated for, COVID-19 infection. Ask your healthcare provider to call the local or state health department.  Wear a facemask You should wear a facemask that covers your nose and mouth when you are in the same room with other people and when you visit a healthcare provider. People  who live with or visit you should also wear a facemask while they are in the same room with you.  Separate yourself from other people in your home As much as possible, you should stay in a different room from other people in your home. Also, you should use a separate bathroom, if available.  Avoid sharing household items You should not share dishes, drinking glasses, cups, eating utensils, towels, bedding, or other items with other people in your home. After using these items, you should wash them thoroughly with soap and water.  Cover your coughs and sneezes Cover your mouth and nose with a tissue when you cough or sneeze, or you can cough or sneeze into your sleeve. Throw used tissues in a lined trash can, and immediately wash your hands with soap and water for at least 20 seconds or use an alcohol-based hand rub.  Wash your [employer] Wash your hands often and thoroughly with soap and water for at least 20 seconds. You can use an alcohol-based hand sanitizer if soap and water are not available and if your hands are not visibly dirty. Avoid touching your eyes, nose, and mouth with unwashed hands.   Prevention Steps for Caregivers and Household Members of Individuals Confirmed to have, or Being Evaluated for, COVID-19 Infection Being Cared for in the Home  If you live with, or provide care at home for, a person confirmed to have, or being evaluated for, COVID-19 infection please follow these guidelines to prevent infection:  Follow healthcare provider's instructions Make sure that you understand and can help the patient follow any  healthcare provider instructions for all care.  Provide for the patient's basic needs You should help the patient with basic needs in the home and provide support for getting groceries, prescriptions, and other personal needs.  Monitor the patient's symptoms If they are getting sicker, call his or her medical provider and tell them that the patient has, or  is being evaluated for, COVID-19 infection. This will help the healthcare provider's office take steps to keep other people from getting infected. Ask the healthcare provider to call the local or state health department.  Limit the number of people who have contact with the patient  If possible, have only one caregiver for the patient.  Other household members should stay in another home or place of residence. If this is not possible, they should stay  in another room, or be separated from the patient as much as possible. Use a separate bathroom, if available.  Restrict visitors who do not have an essential need to be in the home.  Keep older adults, very young children, and other sick people away from the patient Keep older adults, very young children, and those who have compromised immune systems or chronic health conditions away from the patient. This includes people with chronic heart, lung, or kidney conditions, diabetes, and cancer.  Ensure good ventilation Make sure that shared spaces in the home have good air flow, such as from an air conditioner or an opened window, weather permitting.  Wash your hands often  Wash your hands often and thoroughly with soap and water for at least 20 seconds. You can use an alcohol based hand sanitizer if soap and water are not available and if your hands are not visibly dirty.  Avoid touching your eyes, nose, and mouth with unwashed hands.  Use disposable paper towels to dry your hands. If not available, use dedicated cloth towels and replace them when they become wet.  Wear a facemask and gloves  Wear a disposable facemask at all times in the room and gloves when you touch or have contact with the patient's blood, body fluids, and/or secretions or excretions, such as sweat, saliva, sputum, nasal mucus, vomit, urine, or feces.  Ensure the mask fits over your nose and mouth tightly, and do not touch it during use.  Throw out disposable  facemasks and gloves after using them. Do not reuse.  Wash your hands immediately after removing your facemask and gloves.  If your personal clothing becomes contaminated, carefully remove clothing and launder. Wash your hands after handling contaminated clothing.  Place all used disposable facemasks, gloves, and other waste in a lined container before disposing them with other household waste.  Remove gloves and wash your hands immediately after handling these items.  Do not share dishes, glasses, or other household items with the patient  Avoid sharing household items. You should not share dishes, drinking glasses, cups, eating utensils, towels, bedding, or other items with a patient who is confirmed to have, or being evaluated for, COVID-19 infection.  After the person uses these items, you should wash them thoroughly with soap and water.  Wash laundry thoroughly  Immediately remove and wash clothes or bedding that have blood, body fluids, and/or secretions or excretions, such as sweat, saliva, sputum, nasal mucus, vomit, urine, or feces, on them.  Wear gloves when handling laundry from the patient.  Read and follow directions on labels of laundry or clothing items and detergent. In general, wash and dry with the warmest temperatures recommended on the  label.  Clean all areas the individual has used often  Clean all touchable surfaces, such as counters, tabletops, doorknobs, bathroom fixtures, toilets, phones, keyboards, tablets, and bedside tables, every day. Also, clean any surfaces that may have blood, body fluids, and/or secretions or excretions on them.  Wear gloves when cleaning surfaces the patient has come in contact with.  Use a diluted bleach solution (e.g., dilute bleach with 1 part bleach and 10 parts water) or a household disinfectant with a label that says EPA-registered for coronaviruses. To make a bleach solution at home, add 1 tablespoon of bleach to 1 quart (4 cups)  of water. For a larger supply, add  cup of bleach to 1 gallon (16 cups) of water.  Read labels of cleaning products and follow recommendations provided on product labels. Labels contain instructions for safe and effective use of the cleaning product including precautions you should take when applying the product, such as wearing gloves or eye protection and making sure you have good ventilation during use of the product.  Remove gloves and wash hands immediately after cleaning.  Monitor yourself for signs and symptoms of illness Caregivers and household members are considered close contacts, should monitor their health, and will be asked to limit movement outside of the home to the extent possible. Follow the monitoring steps for close contacts listed on the symptom monitoring form.   ? If you have additional questions, contact your local health department or call the epidemiologist on call at [phone] (available 24/7). ? This guidance is subject to change. For the most up-to-date guidance from [facility], please refer to their website: [email or web address]

## 2019-04-28 NOTE — Progress Notes (Signed)
Patient scheduled for outpatient Remdesivir infusion at 230PM on Tuesday 12/29.  Please advise them to report to [facility] at [address].  Drive to the security guard and tell them you are here for an infusion. They will direct you to the front entrance where we will come and get you.  For questions call [phone].  Thanks   

## 2019-04-28 NOTE — Discharge Summary (Signed)
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 last 72 hours.  Invalid input(s): PT Microbiology: Recent Results (from the past 240 hour(s))  Blood Culture (routine x 2)     Status: None (Preliminary result)   Collection Time: 04/24/19  9:57 PM   Specimen: BLOOD  Result Value Ref Range Status   Specimen Description   Final    BLOOD LEFT ANTECUBITAL Performed at [facility] [address], [place], [place] [address]    Special Requests   Final    BOTTLES DRAWN AEROBIC AND ANAEROBIC Blood Culture results may not be optimal due to an excessive volume of blood received in culture bottles Performed at [facility] [address] [place], [place] [address]    Culture   Final    NO GROWTH 3 DAYS Performed  at [facility], [place] [address]  [place], [place] [address]    Report Status PENDING  Incomplete    FURTHER DISCHARGE INSTRUCTIONS:  Get Medicines reviewed and adjusted: Please take all your medications with you for your next visit with your Primary MD  Laboratory/radiological data: Please request your Primary MD to go over all hospital tests and procedure/radiological results at the follow up, please ask your Primary MD to get all Hospital records sent to his/her office.  In some cases, they will be blood work, cultures and biopsy results pending at the time of your discharge. Please request that your primary care M.D. goes through all the records of your hospital data and follows up on these results.  Also Note the following: If you experience worsening of your admission symptoms, develop shortness of breath, life threatening emergency, suicidal or homicidal thoughts you must seek medical attention immediately by calling 911 or calling your MD immediately  if symptoms less severe.  You must read complete instructions/literature along with all the possible adverse reactions/side effects for all the Medicines you take and that have been prescribed to you. Take any new Medicines after you have completely understood and accpet all the possible adverse reactions/side effects.   Do not drive when taking Pain medications or sleeping medications (Benzodaizepines)  Do not take more than prescribed Pain, Sleep and Anxiety Medications. It is not advisable to combine anxiety,sleep and pain medications without talking with your primary care practitioner  Special Instructions: If you have smoked or chewed Tobacco  in the last 2 yrs please stop smoking, stop any regular Alcohol  and or any Recreational drug use.  Wear Seat belts while driving.  Please note: You were cared for by a hospitalist during your hospital stay. Once you are discharged, your primary care physician will handle any further medical issues. Please note that NO REFILLS  for any discharge medications will be authorized once you are discharged, as it is imperative that you return to your primary care physician (or establish a relationship with a primary care physician if you do not have one) for your post hospital discharge needs so that they can reassess your need for medications and monitor your lab values.  Total Time spent coordinating discharge including counseling, education and face to face time equals 35 minutes.  SignedOren Binet 04/28/2019 10:06 AM

## 2019-04-28 NOTE — Plan of Care (Signed)

## 2019-04-29 ENCOUNTER — Ambulatory Visit (HOSPITAL_COMMUNITY)
Admission: RE | Admit: 2019-04-29 | Discharge: 2019-04-29 | Disposition: A | Discharge status: Home / Self Care | Payer: BLUE CROSS/BLUE SHIELD | Source: Ambulatory Visit | Attending: Pulmonary Disease | Admitting: Pulmonary Disease

## 2019-04-29 DIAGNOSIS — U071 COVID-19: Secondary | ICD-10-CM | POA: Diagnosis not present

## 2019-04-29 LAB — CULTURE, BLOOD (ROUTINE X 2): Culture: NO GROWTH

## 2019-04-29 MED ORDER — SODIUM CHLORIDE 0.9 % IV SOLN
INTRAVENOUS | Status: DC | PRN
  Administered 2019-04-29: 15:00:00 250 mL via INTRAVENOUS

## 2019-04-29 MED ORDER — METHYLPREDNISOLONE SODIUM SUCC 125 MG IJ SOLR: 125.0000 mg | Freq: Once | INTRAMUSCULAR | Status: DC | PRN

## 2019-04-29 MED ORDER — ALBUTEROL SULFATE HFA 108 (90 BASE) MCG/ACT IN AERS: 2.0000 | INHALATION_SPRAY | Freq: Once | RESPIRATORY_TRACT | Status: DC | PRN

## 2019-04-29 MED ORDER — SODIUM CHLORIDE 0.9 % IV SOLN
INTRAVENOUS | Status: AC
  Filled 2019-04-29: qty 20

## 2019-04-29 MED ORDER — SODIUM CHLORIDE 0.9 % IV SOLN
100.0000 mg | Freq: Once | INTRAVENOUS | Status: AC
  Administered 2019-04-29: 100 mg via INTRAVENOUS

## 2019-04-29 MED ORDER — EPINEPHRINE 0.3 MG/0.3ML IJ SOAJ: 0.3000 mg | Freq: Once | INTRAMUSCULAR | Status: DC | PRN

## 2019-04-29 MED ORDER — DIPHENHYDRAMINE HCL 50 MG/ML IJ SOLN: 50.0000 mg | Freq: Once | INTRAMUSCULAR | Status: DC | PRN

## 2019-04-29 MED ORDER — FAMOTIDINE IN NACL 20-0.9 MG/50ML-% IV SOLN: 20.0000 mg | Freq: Once | INTRAVENOUS | Status: DC | PRN

## 2019-04-29 NOTE — Progress Notes (Signed)
  Diagnosis: COVID-19  Physician: Dr. Wright  Procedure: [facility] Infusion Clinic Med: remdesivir infusion.  Complications: No immediate complications noted.  Discharge: Discharged home   Billy Cowan 04/29/2019   

## 2019-05-22 ENCOUNTER — Other Ambulatory Visit: Payer: Self-pay

## 2019-06-05 ENCOUNTER — Other Ambulatory Visit: Payer: Self-pay | Admitting: Family Medicine

## 2019-06-05 ENCOUNTER — Ambulatory Visit
Admission: RE | Admit: 2019-06-05 | Discharge: 2019-06-05 | Disposition: A | Discharge status: Home / Self Care | Payer: 59 | Source: Ambulatory Visit | Attending: Family Medicine | Admitting: Family Medicine

## 2019-06-05 DIAGNOSIS — R9389 Abnormal findings on diagnostic imaging of other specified body structures: Secondary | ICD-10-CM

## 2019-06-05 DIAGNOSIS — E782 Mixed hyperlipidemia: Secondary | ICD-10-CM

## 2020-06-02 ENCOUNTER — Other Ambulatory Visit: Payer: Self-pay

## 2020-06-02 ENCOUNTER — Other Ambulatory Visit (HOSPITAL_COMMUNITY)
Admission: RE | Admit: 2020-06-02 | Discharge: 2020-06-02 | Disposition: A | Discharge status: Home / Self Care | Payer: 59 | Source: Ambulatory Visit | Attending: Internal Medicine | Admitting: Internal Medicine

## 2020-06-02 ENCOUNTER — Ambulatory Visit (INDEPENDENT_AMBULATORY_CARE_PROVIDER_SITE_OTHER): Payer: 59 | Admitting: Internal Medicine

## 2020-06-02 ENCOUNTER — Encounter: Payer: Self-pay | Admitting: Internal Medicine

## 2020-06-02 VITALS — BP 131/93 | HR 131 | Temp 97.5°F | Ht 70.0 in | Wt 176.0 lb

## 2020-06-02 DIAGNOSIS — M2550 Pain in unspecified joint: Secondary | ICD-10-CM | POA: Insufficient documentation

## 2020-06-02 DIAGNOSIS — Z9081 Acquired absence of spleen: Secondary | ICD-10-CM | POA: Diagnosis present

## 2020-06-02 DIAGNOSIS — R509 Fever, unspecified: Secondary | ICD-10-CM | POA: Diagnosis not present

## 2020-06-02 DIAGNOSIS — R634 Abnormal weight loss: Secondary | ICD-10-CM | POA: Diagnosis not present

## 2020-06-02 MED ORDER — AMOXICILLIN-POT CLAVULANATE 875-125 MG PO TABS: ORAL_TABLET | ORAL | 0 refills | Status: DC

## 2020-06-02 NOTE — Assessment & Plan Note (Addendum)
Patient with history of splenectomy in 1996 and reports having received his postsplenectomy vaccines at that time.  He has not kept any antibiotics on hand at home in case of fever.  PLAN:  Patient provided with prescription for an emergency supply of Augmentin to have on hand in case of fever or other signs and symptoms of systemic illness (such as chills, rigors, vomiting, diarrhea) along with instructions to present to nearest urgent care/emergency department or to contact his primary care immediately.  Also advised to follow-up with primary care to ensure he has received appropriate vaccination

## 2020-06-02 NOTE — Patient Instructions (Signed)
Thank you for coming to see me today. It was a pleasure seeing you.  To Do: Mar[facility] Kitchen Labs today . Follow up with rheumatology next week . Follow up with me in about 2 weeks . I will update you if anything on your labs is positive  If you have any questions or concerns, please do not hesitate to call the office at [phone].  Take Care,   Gwynn Burly, DO

## 2020-06-02 NOTE — Assessment & Plan Note (Signed)
Patient reports typical weight of about 210 pounds and is currently 176.  This is unintended over the past 6 weeks raising concern for some occult process.  He has been referred to oncology but currently awaiting appointment.  PLAN: . Infectious work up as outlined . Oncology referral pending.  If no etiology of symptoms found, consider pan-CT scan
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
## 2020-06-02 NOTE — Assessment & Plan Note (Signed)
Patient is presenting with polyarticular arthritis, weight loss, isolated fever that started approximately 6 weeks ago with rather acute onset.  This has coincided with high inflammatory markers but negative synovial fluid aspirate for infection or crystals.  Referred to rheumatology for possible gout flare not responding to oral prednisone and colchicine, however, blood test with normal uric acid.  Gout still remains high on the differential diagnosis based on his previous history of periodic joint swelling that coincides with increased alcohol intake.  He also has a strong family history of gout.  Additionally he has a family history of rheumatoid arthritis in his sister that is seronegative.  Additionally, he seems to have responded fairly well to his recent steroid injection with decreasing pain and now able to tolerate more physical activity.  Inflammatory markers remain very high when checked last week by his rheumatologist and she is planning to repeat these again early next week.  Differential diagnosis from an infectious disease standpoint for polyarthralgia includes mostly viral etiologies.  I would not suspect a bacterial infection to linger for several weeks without antibiotics in a splenectomized patient without causing more overwhelming infection.  He has no lymphadenopathy and no further fevers since his initial onset of symptoms.  We will screen for a variety of viral illnesses given his multiple joint pains and recent travel history.  We will also check HIV, hepatitis serologies, RPR, Quantiferon.  Will check urine gonorrhea and chlamydia.  Although would be unusual and low suspicion currently for reinfection with Covid, will also check PCR for this.  Will have patient follow-up in about 2 weeks to reassess his symptoms and discuss lab results.

## 2020-06-03 LAB — URINE CYTOLOGY ANCILLARY ONLY
Chlamydia: NEGATIVE
Comment: NEGATIVE
Comment: NORMAL
Neisseria Gonorrhea: NEGATIVE

## 2020-06-04 LAB — SARS-COV-2 RNA,(COVID-19) QUALITATIVE NAAT: SARS CoV2 RNA: NOT DETECTED

## 2020-06-04 LAB — CMV ABS, IGG+IGM (CYTOMEGALOVIRUS)
CMV IgM: <30 AU/mL
Cytomegalovirus Ab-IgG: <0.6 U/mL

## 2020-06-04 LAB — EPSTEIN-BARR VIRUS EARLY D ANTIGEN ANTIBODY, IGG: EBV EA IgG: 107 U/mL — ABNORMAL HIGH

## 2020-06-07 ENCOUNTER — Telehealth: Payer: Self-pay

## 2020-06-07 NOTE — Telephone Encounter (Signed)
-----   Message from Kathlynn Grate, DO sent at 06/07/2020  3:47 PM EST ----- Can you please let patient know that so far all his labs from an infectious disease standpoint have been negative to date.

## 2020-06-07 NOTE — Telephone Encounter (Signed)
RN spoke with patient to let him know per Dr. Earlene Plater that so far all of his labs from an infectious disease standpoint have been negative to date. Patient expressed concern regarding parvovirus results. RN spoke with Dr. Earlene Plater and relayed to patient that Dr. Earlene Plater says this result shows a previous exposure to parvovirus, but does not indicate an active infection. Patient verbalized understanding and has no further questions.   Sandie Ano, RN

## 2020-06-08 LAB — CBC
HCT: 39.4 % (ref 38.5–50.0)
Hemoglobin: 13.3 g/dL (ref 13.2–17.1)
MCH: 28.4 pg (ref 27.0–33.0)
MCHC: 33.8 g/dL (ref 32.0–36.0)
MCV: 84.2 fL (ref 80.0–100.0)
MPV: 9.6 fL (ref 7.5–12.5)
Platelets: 752 10*3/uL — ABNORMAL HIGH (ref 140–400)
RBC: 4.68 10*6/uL (ref 4.20–5.80)
RDW: 13.2 % (ref 11.0–15.0)
WBC: 13.1 10*3/uL — ABNORMAL HIGH (ref 3.8–10.8)

## 2020-06-08 LAB — HEPATITIS B SURFACE ANTIGEN: Hepatitis B Surface Ag: NONREACTIVE

## 2020-06-08 LAB — EPSTEIN-BARR VIRUS VCA ANTIBODY PANEL
EBV NA IgG: 170 U/mL — ABNORMAL HIGH
EBV VCA IgG: 120 U/mL — ABNORMAL HIGH
EBV VCA IgM: <36 U/mL

## 2020-06-08 LAB — COMPLETE METABOLIC PANEL WITH GFR
AG Ratio: 1.3 (calc) (ref 1.0–2.5)
ALT: 22 U/L (ref 9–46)
AST: 10 U/L (ref 10–35)
Albumin: 3.9 g/dL (ref 3.6–5.1)
Alkaline phosphatase (APISO): 74 U/L (ref 35–144)
BUN/Creatinine Ratio: 43 (calc) — ABNORMAL HIGH (ref 6–22)
BUN: 40 mg/dL — ABNORMAL HIGH (ref 7–25)
CO2: 17 mmol/L — ABNORMAL LOW (ref 20–32)
Calcium: 10 mg/dL (ref 8.6–10.3)
Chloride: 97 mmol/L — ABNORMAL LOW (ref 98–110)
Creat: 0.94 mg/dL (ref 0.70–1.33)
GFR, Est African American: 107 mL/min/{1.73_m2} (ref >=60)
GFR, Est Non African American: 92 mL/min/{1.73_m2} (ref >=60)
Globulin: 3.1 g/dL (calc) (ref 1.9–3.7)
Glucose, Bld: 291 mg/dL — ABNORMAL HIGH (ref 65–99)
Potassium: 4.9 mmol/L (ref 3.5–5.3)
Sodium: 130 mmol/L — ABNORMAL LOW (ref 135–146)
Total Bilirubin: 0.4 mg/dL (ref 0.2–1.2)
Total Protein: 7 g/dL (ref 6.1–8.1)

## 2020-06-08 LAB — HEPATITIS B SURFACE ANTIBODY,QUALITATIVE: Hep B S Ab: NONREACTIVE

## 2020-06-08 LAB — CULTURE, BLOOD (SINGLE)
MICRO NUMBER:: 11487609
MICRO NUMBER:: 11487608
Result:: NO GROWTH
Result:: NO GROWTH
SPECIMEN QUALITY:: ADEQUATE
SPECIMEN QUALITY:: ADEQUATE

## 2020-06-08 LAB — HIV ANTIBODY (ROUTINE TESTING W REFLEX): HIV 1&2 Ab, 4th Generation: NONREACTIVE

## 2020-06-08 LAB — CHIKUNGUNYA ABS W/REFLEX TO TITER
Chikungunya IgG Screen: NEGATIVE
Chikungunya IgM Screen: NEGATIVE

## 2020-06-08 LAB — QUANTIFERON-TB GOLD PLUS
Mitogen-NIL: 1.02 IU/mL
NIL: 0.02 IU/mL
QuantiFERON-TB Gold Plus: NEGATIVE
TB1-NIL: 0 IU/mL
TB2-NIL: 0 IU/mL

## 2020-06-08 LAB — HEPATITIS C ANTIBODY
Hepatitis C Ab: NONREACTIVE
SIGNAL TO CUT-OFF: 0.43 (ref <1.00)

## 2020-06-08 LAB — HEPATITIS B CORE ANTIBODY, TOTAL: Hep B Core Total Ab: NONREACTIVE

## 2020-06-08 LAB — HEPATITIS A ANTIBODY, TOTAL: Hepatitis A AB,Total: NONREACTIVE

## 2020-06-08 LAB — PARVOVIRUS B19 ANTIBODY, IGG AND IGM
Parvovirus B19 IgG: 4.2 — ABNORMAL HIGH (ref <0.9)
Parvovirus B19 IgM: 0 (ref <0.9)

## 2020-06-08 LAB — RPR: RPR Ser Ql: NONREACTIVE

## 2020-06-15 ENCOUNTER — Ambulatory Visit: Payer: 59 | Admitting: Internal Medicine

## 2020-07-19 ENCOUNTER — Inpatient Hospital Stay (HOSPITAL_COMMUNITY)
Admission: EM | Admit: 2020-07-19 | Discharge: 2020-08-01 | DRG: 982 | Disposition: A | Discharge status: Home / Self Care | Payer: 59 | Attending: Internal Medicine | Admitting: Internal Medicine

## 2020-07-19 ENCOUNTER — Encounter (HOSPITAL_COMMUNITY): Payer: Self-pay | Admitting: Internal Medicine

## 2020-07-19 ENCOUNTER — Emergency Department (HOSPITAL_COMMUNITY): Payer: 59

## 2020-07-19 DIAGNOSIS — E785 Hyperlipidemia, unspecified: Secondary | ICD-10-CM | POA: Diagnosis present

## 2020-07-19 DIAGNOSIS — E781 Pure hyperglyceridemia: Secondary | ICD-10-CM | POA: Diagnosis present

## 2020-07-19 DIAGNOSIS — K567 Ileus, unspecified: Secondary | ICD-10-CM | POA: Diagnosis present

## 2020-07-19 DIAGNOSIS — J9811 Atelectasis: Secondary | ICD-10-CM | POA: Diagnosis present

## 2020-07-19 DIAGNOSIS — K922 Gastrointestinal hemorrhage, unspecified: Secondary | ICD-10-CM | POA: Diagnosis not present

## 2020-07-19 DIAGNOSIS — D62 Acute posthemorrhagic anemia: Secondary | ICD-10-CM | POA: Diagnosis present

## 2020-07-19 DIAGNOSIS — Z833 Family history of diabetes mellitus: Secondary | ICD-10-CM

## 2020-07-19 DIAGNOSIS — B3781 Candidal esophagitis: Secondary | ICD-10-CM | POA: Diagnosis present

## 2020-07-19 DIAGNOSIS — M1A9XX0 Chronic gout, unspecified, without tophus (tophi): Secondary | ICD-10-CM | POA: Diagnosis present

## 2020-07-19 DIAGNOSIS — Z9081 Acquired absence of spleen: Secondary | ICD-10-CM

## 2020-07-19 DIAGNOSIS — M109 Gout, unspecified: Secondary | ICD-10-CM | POA: Diagnosis present

## 2020-07-19 DIAGNOSIS — K851 Biliary acute pancreatitis without necrosis or infection: Secondary | ICD-10-CM | POA: Diagnosis present

## 2020-07-19 DIAGNOSIS — Z20822 Contact with and (suspected) exposure to covid-19: Secondary | ICD-10-CM | POA: Diagnosis present

## 2020-07-19 DIAGNOSIS — R600 Localized edema: Secondary | ICD-10-CM | POA: Diagnosis present

## 2020-07-19 DIAGNOSIS — R9431 Abnormal electrocardiogram [ECG] [EKG]: Secondary | ICD-10-CM | POA: Diagnosis not present

## 2020-07-19 DIAGNOSIS — K858 Other acute pancreatitis without necrosis or infection: Secondary | ICD-10-CM | POA: Diagnosis present

## 2020-07-19 DIAGNOSIS — E1165 Type 2 diabetes mellitus with hyperglycemia: Secondary | ICD-10-CM | POA: Diagnosis present

## 2020-07-19 DIAGNOSIS — E11649 Type 2 diabetes mellitus with hypoglycemia without coma: Secondary | ICD-10-CM | POA: Diagnosis present

## 2020-07-19 DIAGNOSIS — Z4682 Encounter for fitting and adjustment of non-vascular catheter: Secondary | ICD-10-CM

## 2020-07-19 DIAGNOSIS — E1169 Type 2 diabetes mellitus with other specified complication: Secondary | ICD-10-CM | POA: Diagnosis present

## 2020-07-19 DIAGNOSIS — E8809 Other disorders of plasma-protein metabolism, not elsewhere classified: Secondary | ICD-10-CM | POA: Diagnosis present

## 2020-07-19 DIAGNOSIS — Z79899 Other long term (current) drug therapy: Secondary | ICD-10-CM | POA: Diagnosis not present

## 2020-07-19 DIAGNOSIS — R109 Unspecified abdominal pain: Secondary | ICD-10-CM

## 2020-07-19 DIAGNOSIS — G8921 Chronic pain due to trauma: Secondary | ICD-10-CM | POA: Diagnosis present

## 2020-07-19 DIAGNOSIS — R0781 Pleurodynia: Secondary | ICD-10-CM

## 2020-07-19 DIAGNOSIS — E8770 Fluid overload, unspecified: Secondary | ICD-10-CM | POA: Diagnosis present

## 2020-07-19 DIAGNOSIS — Z8261 Family history of arthritis: Secondary | ICD-10-CM

## 2020-07-19 DIAGNOSIS — E119 Type 2 diabetes mellitus without complications: Secondary | ICD-10-CM

## 2020-07-19 DIAGNOSIS — Z7952 Long term (current) use of systemic steroids: Secondary | ICD-10-CM | POA: Diagnosis not present

## 2020-07-19 DIAGNOSIS — K921 Melena: Secondary | ICD-10-CM | POA: Diagnosis present

## 2020-07-19 DIAGNOSIS — D75839 Thrombocytosis, unspecified: Secondary | ICD-10-CM | POA: Diagnosis present

## 2020-07-19 DIAGNOSIS — Z7984 Long term (current) use of oral hypoglycemic drugs: Secondary | ICD-10-CM | POA: Diagnosis not present

## 2020-07-19 DIAGNOSIS — K828 Other specified diseases of gallbladder: Secondary | ICD-10-CM | POA: Diagnosis present

## 2020-07-19 DIAGNOSIS — E876 Hypokalemia: Secondary | ICD-10-CM | POA: Diagnosis present

## 2020-07-19 DIAGNOSIS — Z8616 Personal history of COVID-19: Secondary | ICD-10-CM

## 2020-07-19 DIAGNOSIS — R1011 Right upper quadrant pain: Secondary | ICD-10-CM | POA: Diagnosis not present

## 2020-07-19 DIAGNOSIS — M7989 Other specified soft tissue disorders: Secondary | ICD-10-CM | POA: Diagnosis not present

## 2020-07-19 DIAGNOSIS — R918 Other nonspecific abnormal finding of lung field: Secondary | ICD-10-CM

## 2020-07-19 HISTORY — DX: Gout, unspecified: M10.9

## 2020-07-19 LAB — LIPID PANEL
Cholesterol: 198 mg/dL (ref 0–200)
HDL: 29 mg/dL — ABNORMAL LOW (ref >40)
LDL Cholesterol: 120 mg/dL — ABNORMAL HIGH (ref 0–99)
Total CHOL/HDL Ratio: 6.8 RATIO
Triglycerides: 245 mg/dL — ABNORMAL HIGH (ref <150)
VLDL: 49 mg/dL — ABNORMAL HIGH (ref 0–40)

## 2020-07-19 LAB — COMPREHENSIVE METABOLIC PANEL WITH GFR
ALT: 75 U/L — ABNORMAL HIGH (ref 0–44)
AST: 124 U/L — ABNORMAL HIGH (ref 15–41)
Albumin: 3.6 g/dL (ref 3.5–5.0)
Alkaline Phosphatase: 54 U/L (ref 38–126)
Anion gap: 14 (ref 5–15)
BUN: 16 mg/dL (ref 6–20)
CO2: 16 mmol/L — ABNORMAL LOW (ref 22–32)
Calcium: 10.1 mg/dL (ref 8.9–10.3)
Chloride: 105 mmol/L (ref 98–111)
Creatinine, Ser: 0.85 mg/dL (ref 0.61–1.24)
GFR, Estimated: >60 mL/min
Glucose, Bld: 230 mg/dL — ABNORMAL HIGH (ref 70–99)
Potassium: 4.1 mmol/L (ref 3.5–5.1)
Sodium: 135 mmol/L (ref 135–145)
Total Bilirubin: 3.4 mg/dL — ABNORMAL HIGH (ref 0.3–1.2)
Total Protein: 7.7 g/dL (ref 6.5–8.1)

## 2020-07-19 LAB — CBC
HCT: 39.7 % (ref 39.0–52.0)
Hemoglobin: 12.4 g/dL — ABNORMAL LOW (ref 13.0–17.0)
MCH: 28.5 pg (ref 26.0–34.0)
MCHC: 31.2 g/dL (ref 30.0–36.0)
MCV: 91.3 fL (ref 80.0–100.0)
Platelets: 543 10*3/uL — ABNORMAL HIGH (ref 150–400)
RBC: 4.35 MIL/uL (ref 4.22–5.81)
RDW: 15.8 % — ABNORMAL HIGH (ref 11.5–15.5)
WBC: 21.7 10*3/uL — ABNORMAL HIGH (ref 4.0–10.5)
nRBC: 0 % (ref 0.0–0.2)

## 2020-07-19 LAB — RESP PANEL BY RT-PCR (FLU A&B, COVID) ARPGX2
Influenza A by PCR: NEGATIVE
Influenza B by PCR: NEGATIVE
SARS Coronavirus 2 by RT PCR: NEGATIVE

## 2020-07-19 LAB — URINALYSIS, ROUTINE W REFLEX MICROSCOPIC
Bacteria, UA: NONE SEEN
Bilirubin Urine: NEGATIVE
Glucose, UA: >=500 mg/dL — AB
Hgb urine dipstick: NEGATIVE
Ketones, ur: 5 mg/dL — AB
Leukocytes,Ua: NEGATIVE
Nitrite: NEGATIVE
Protein, ur: NEGATIVE mg/dL
Specific Gravity, Urine: 1.044 — ABNORMAL HIGH (ref 1.005–1.030)
pH: 5 (ref 5.0–8.0)

## 2020-07-19 LAB — HEPATITIS PANEL, ACUTE
HCV Ab: NONREACTIVE
Hep A IgM: NONREACTIVE
Hep B C IgM: NONREACTIVE
Hepatitis B Surface Ag: NONREACTIVE

## 2020-07-19 LAB — CBG MONITORING, ED
Glucose-Capillary: 127 mg/dL — ABNORMAL HIGH (ref 70–99)
Glucose-Capillary: 110 mg/dL — ABNORMAL HIGH (ref 70–99)

## 2020-07-19 LAB — LACTIC ACID, PLASMA
Lactic Acid, Venous: 1.4 mmol/L (ref 0.5–1.9)
Lactic Acid, Venous: 3.2 mmol/L (ref 0.5–1.9)
Lactic Acid, Venous: 2.6 mmol/L (ref 0.5–1.9)
Lactic Acid, Venous: 2.1 mmol/L (ref 0.5–1.9)

## 2020-07-19 LAB — GLUCOSE, CAPILLARY: Glucose-Capillary: 125 mg/dL — ABNORMAL HIGH (ref 70–99)

## 2020-07-19 LAB — PROCALCITONIN: Procalcitonin: 1.97 ng/mL

## 2020-07-19 LAB — HEMOGLOBIN A1C
Hgb A1c MFr Bld: 9 % — ABNORMAL HIGH (ref 4.8–5.6)
Mean Plasma Glucose: 211.6 mg/dL

## 2020-07-19 LAB — PROTIME-INR
INR: 1.1 (ref 0.8–1.2)
Prothrombin Time: 13.5 seconds (ref 11.4–15.2)

## 2020-07-19 LAB — ACETAMINOPHEN LEVEL: Acetaminophen (Tylenol), Serum: 10 ug/mL (ref 10–30)

## 2020-07-19 LAB — LACTATE DEHYDROGENASE: LDH: 285 U/L — ABNORMAL HIGH (ref 98–192)

## 2020-07-19 LAB — LIPASE, BLOOD: Lipase: 5939 U/L — ABNORMAL HIGH (ref 11–51)

## 2020-07-19 MED ORDER — ONDANSETRON 4 MG PO TBDP
4.0000 mg | ORAL_TABLET | Freq: Once | ORAL | Status: AC | PRN
  Administered 2020-07-19: 4 mg via ORAL
  Filled 2020-07-19: qty 1

## 2020-07-19 MED ORDER — HYDROCORTISONE NA SUCCINATE PF 100 MG IJ SOLR
50.0000 mg | Freq: Four times a day (QID) | INTRAMUSCULAR | Status: DC
  Administered 2020-07-19 – 2020-07-20 (×4): 50 mg via INTRAVENOUS
  Filled 2020-07-19 (×4): qty 2

## 2020-07-19 MED ORDER — ACETAMINOPHEN 325 MG PO TABS
650.0000 mg | ORAL_TABLET | Freq: Once | ORAL | Status: AC | PRN
  Administered 2020-07-19: 650 mg via ORAL
  Filled 2020-07-19: qty 2

## 2020-07-19 MED ORDER — ACETAMINOPHEN 325 MG PO TABS: 650.0000 mg | ORAL_TABLET | Freq: Four times a day (QID) | ORAL | Status: DC | PRN

## 2020-07-19 MED ORDER — INSULIN ASPART 100 UNIT/ML ~~LOC~~ SOLN: 0.0000 [IU] | Freq: Every day | SUBCUTANEOUS | Status: DC

## 2020-07-19 MED ORDER — INSULIN ASPART 100 UNIT/ML ~~LOC~~ SOLN
0.0000 [IU] | Freq: Three times a day (TID) | SUBCUTANEOUS | Status: DC
  Administered 2020-07-21: 2 [IU] via SUBCUTANEOUS

## 2020-07-19 MED ORDER — PIPERACILLIN-TAZOBACTAM 3.375 G IVPB
3.3750 g | Freq: Three times a day (TID) | INTRAVENOUS | Status: DC
  Administered 2020-07-19 – 2020-07-26 (×21): 3.375 g via INTRAVENOUS
  Filled 2020-07-19 (×22): qty 50

## 2020-07-19 MED ORDER — ONDANSETRON HCL 4 MG/2ML IJ SOLN
4.0000 mg | Freq: Once | INTRAMUSCULAR | Status: AC
  Administered 2020-07-19: 4 mg via INTRAVENOUS
  Filled 2020-07-19: qty 2

## 2020-07-19 MED ORDER — IOHEXOL 300 MG/ML  SOLN
100.0000 mL | Freq: Once | INTRAMUSCULAR | Status: AC | PRN
  Administered 2020-07-19: 100 mL via INTRAVENOUS

## 2020-07-19 MED ORDER — HYDRALAZINE HCL 20 MG/ML IJ SOLN
5.0000 mg | INTRAMUSCULAR | Status: DC | PRN
  Administered 2020-07-20: 5 mg via INTRAVENOUS
  Filled 2020-07-19: qty 1

## 2020-07-19 MED ORDER — LACTATED RINGERS IV BOLUS (SEPSIS)
1000.0000 mL | Freq: Once | INTRAVENOUS | Status: AC
  Administered 2020-07-19: 1000 mL via INTRAVENOUS

## 2020-07-19 MED ORDER — HYDROMORPHONE HCL 1 MG/ML IJ SOLN
0.5000 mg | Freq: Once | INTRAMUSCULAR | Status: AC
Start: 2020-07-19 — End: 2020-07-19
  Administered 2020-07-19: 0.5 mg via INTRAVENOUS
  Filled 2020-07-19: qty 1

## 2020-07-19 MED ORDER — PIPERACILLIN-TAZOBACTAM 3.375 G IVPB 30 MIN
3.3750 g | Freq: Once | INTRAVENOUS | Status: AC
  Administered 2020-07-19: 3.375 g via INTRAVENOUS
  Filled 2020-07-19: qty 50

## 2020-07-19 MED ORDER — LACTATED RINGERS IV SOLN: INTRAVENOUS | Status: DC

## 2020-07-19 MED ORDER — MORPHINE SULFATE (PF) 2 MG/ML IV SOLN
2.0000 mg | INTRAVENOUS | Status: DC | PRN
  Administered 2020-07-19 (×2): 2 mg via INTRAVENOUS
  Filled 2020-07-19 (×2): qty 1

## 2020-07-19 MED ORDER — LACTATED RINGERS IV BOLUS
1000.0000 mL | Freq: Once | INTRAVENOUS | Status: AC
  Administered 2020-07-19: 1000 mL via INTRAVENOUS

## 2020-07-19 MED ORDER — HYDROMORPHONE HCL 1 MG/ML IJ SOLN: 0.5000 mg | INTRAMUSCULAR | Status: DC | PRN

## 2020-07-19 MED ORDER — LACTATED RINGERS IV BOLUS
2000.0000 mL | Freq: Once | INTRAVENOUS | Status: AC
  Administered 2020-07-19: 2000 mL via INTRAVENOUS

## 2020-07-19 MED ORDER — ONDANSETRON HCL 4 MG/2ML IJ SOLN
4.0000 mg | Freq: Four times a day (QID) | INTRAMUSCULAR | Status: DC | PRN
  Filled 2020-07-19: qty 2

## 2020-07-19 MED ORDER — ACETAMINOPHEN 650 MG RE SUPP: 650.0000 mg | Freq: Four times a day (QID) | RECTAL | Status: DC | PRN

## 2020-07-19 MED ORDER — ALLOPURINOL 100 MG PO TABS
100.0000 mg | ORAL_TABLET | Freq: Every day | ORAL | Status: DC
  Administered 2020-07-19 – 2020-08-01 (×14): 100 mg via ORAL
  Filled 2020-07-19 (×14): qty 1

## 2020-07-19 MED ORDER — ONDANSETRON HCL 4 MG PO TABS: 4.0000 mg | ORAL_TABLET | Freq: Four times a day (QID) | ORAL | Status: DC | PRN

## 2020-07-19 MED ORDER — FENTANYL CITRATE (PF) 100 MCG/2ML IJ SOLN
100.0000 ug | Freq: Once | INTRAMUSCULAR | Status: AC
Start: 2020-07-19 — End: 2020-07-19
  Administered 2020-07-19: 100 ug via INTRAVENOUS
  Filled 2020-07-19: qty 2

## 2020-07-19 MED ORDER — MORPHINE SULFATE (PF) 2 MG/ML IV SOLN
2.0000 mg | INTRAVENOUS | Status: DC | PRN
  Administered 2020-07-19 (×2): 4 mg via INTRAVENOUS
  Administered 2020-07-19: 2 mg via INTRAVENOUS
  Administered 2020-07-19 – 2020-07-20 (×2): 4 mg via INTRAVENOUS
  Administered 2020-07-20: 2 mg via INTRAVENOUS
  Administered 2020-07-20: 4 mg via INTRAVENOUS
  Filled 2020-07-19 (×7): qty 2

## 2020-07-19 MED ORDER — LACTATED RINGERS IV BOLUS (SEPSIS)
500.0000 mL | Freq: Once | INTRAVENOUS | Status: AC
  Administered 2020-07-19: 500 mL via INTRAVENOUS

## 2020-07-19 NOTE — Progress Notes (Signed)
Notified bedside nurse of need to draw repeat lactic acid and blood cultures.  

## 2020-07-19 NOTE — ED Notes (Signed)
Dr Ophelia Charter secured messaged with pt being consisently tachycardic and EKG was done
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
## 2020-07-19 NOTE — ED Notes (Signed)
Patient to CT.
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
## 2020-07-19 NOTE — Progress Notes (Signed)
Secure message to Dr. Bebe Shaggy with acknowledgement of needed F/U [place] orders

## 2020-07-19 NOTE — ED Triage Notes (Addendum)
Pt reports abdominal pain/ weakness starting around 5pm with n/v/d. Pt states 3 episode of vomiting. Pt denies any Chest pain/ sob and no GI hx and is not covid vaccinated however had his spleen remove in 96
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
## 2020-07-19 NOTE — ED Notes (Signed)
Dr. Bebe Shaggy messaged via Epic to make aware of lactic acid result.

## 2020-07-19 NOTE — ED Notes (Signed)
ED Provider at bedside. 

## 2020-07-19 NOTE — ED Notes (Addendum)
Patient back from CT.

## 2020-07-20 ENCOUNTER — Other Ambulatory Visit: Payer: Self-pay

## 2020-07-20 DIAGNOSIS — K851 Biliary acute pancreatitis without necrosis or infection: Secondary | ICD-10-CM | POA: Diagnosis not present

## 2020-07-20 LAB — GLUCOSE, CAPILLARY
Glucose-Capillary: 104 mg/dL — ABNORMAL HIGH (ref 70–99)
Glucose-Capillary: 104 mg/dL — ABNORMAL HIGH (ref 70–99)
Glucose-Capillary: 100 mg/dL — ABNORMAL HIGH (ref 70–99)
Glucose-Capillary: 101 mg/dL — ABNORMAL HIGH (ref 70–99)
Glucose-Capillary: 103 mg/dL — ABNORMAL HIGH (ref 70–99)

## 2020-07-20 LAB — CBC
HCT: 37 % — ABNORMAL LOW (ref 39.0–52.0)
Hemoglobin: 11.7 g/dL — ABNORMAL LOW (ref 13.0–17.0)
MCH: 28.7 pg (ref 26.0–34.0)
MCHC: 31.6 g/dL (ref 30.0–36.0)
MCV: 90.7 fL (ref 80.0–100.0)
Platelets: 481 10*3/uL — ABNORMAL HIGH (ref 150–400)
RBC: 4.08 MIL/uL — ABNORMAL LOW (ref 4.22–5.81)
RDW: 16.5 % — ABNORMAL HIGH (ref 11.5–15.5)
WBC: 29.9 10*3/uL — ABNORMAL HIGH (ref 4.0–10.5)
nRBC: 0 % (ref 0.0–0.2)

## 2020-07-20 LAB — COMPREHENSIVE METABOLIC PANEL
ALT: 147 U/L — ABNORMAL HIGH (ref 0–44)
AST: 112 U/L — ABNORMAL HIGH (ref 15–41)
Albumin: 2.5 g/dL — ABNORMAL LOW (ref 3.5–5.0)
Alkaline Phosphatase: 94 U/L (ref 38–126)
Anion gap: 20 — ABNORMAL HIGH (ref 5–15)
BUN: 8 mg/dL (ref 6–20)
CO2: 16 mmol/L — ABNORMAL LOW (ref 22–32)
Calcium: 9.4 mg/dL (ref 8.9–10.3)
Chloride: 102 mmol/L (ref 98–111)
Creatinine, Ser: 1.04 mg/dL (ref 0.61–1.24)
GFR, Estimated: >60 mL/min (ref >60)
Glucose, Bld: 95 mg/dL (ref 70–99)
Potassium: 3.6 mmol/L (ref 3.5–5.1)
Sodium: 138 mmol/L (ref 135–145)
Total Bilirubin: 7.2 mg/dL — ABNORMAL HIGH (ref 0.3–1.2)
Total Protein: 6.2 g/dL — ABNORMAL LOW (ref 6.5–8.1)

## 2020-07-20 LAB — URINE CULTURE: Culture: NO GROWTH

## 2020-07-20 LAB — PROCALCITONIN: Procalcitonin: 2.21 ng/mL

## 2020-07-20 LAB — C4 COMPLEMENT: Complement C4, Body Fluid: 35 mg/dL (ref 12–38)

## 2020-07-20 LAB — LIPASE, BLOOD: Lipase: 433 U/L — ABNORMAL HIGH (ref 11–51)

## 2020-07-20 MED ORDER — PREDNISONE 10 MG PO TABS
10.0000 mg | ORAL_TABLET | Freq: Every day | ORAL | Status: DC
  Administered 2020-07-21 – 2020-07-28 (×8): 10 mg via ORAL
  Filled 2020-07-20 (×8): qty 1

## 2020-07-20 MED ORDER — NALOXONE HCL 0.4 MG/ML IJ SOLN
0.4000 mg | INTRAMUSCULAR | Status: DC | PRN
Start: 2020-07-20 — End: 2020-07-22

## 2020-07-20 MED ORDER — METOPROLOL TARTRATE 5 MG/5ML IV SOLN
2.5000 mg | Freq: Once | INTRAVENOUS | Status: AC
  Administered 2020-07-20: 2.5 mg via INTRAVENOUS
  Filled 2020-07-20: qty 5

## 2020-07-20 MED ORDER — DIPHENHYDRAMINE HCL 12.5 MG/5ML PO ELIX: 12.5000 mg | ORAL_SOLUTION | Freq: Four times a day (QID) | ORAL | Status: DC | PRN

## 2020-07-20 MED ORDER — MORPHINE SULFATE (PF) 2 MG/ML IV SOLN
2.0000 mg | Freq: Once | INTRAVENOUS | Status: AC
  Administered 2020-07-20: 2 mg via INTRAVENOUS
  Filled 2020-07-20: qty 1

## 2020-07-20 MED ORDER — DIPHENHYDRAMINE HCL 50 MG/ML IJ SOLN: 12.5000 mg | Freq: Four times a day (QID) | INTRAMUSCULAR | Status: DC | PRN

## 2020-07-20 MED ORDER — MORPHINE SULFATE 1 MG/ML IV SOLN PCA
INTRAVENOUS | Status: DC
Start: 2020-07-20 — End: 2020-07-22
  Administered 2020-07-20: 1 mL via INTRAVENOUS
  Administered 2020-07-20: 22.5 mL via INTRAVENOUS
  Administered 2020-07-21: 7.5 mg via INTRAVENOUS
  Administered 2020-07-21: 8.66 mg via INTRAVENOUS
  Administered 2020-07-21: 4.5 mg via INTRAVENOUS
  Administered 2020-07-21: 6 mg via INTRAVENOUS
  Administered 2020-07-21: 1 mg via INTRAVENOUS
  Administered 2020-07-21: 25.5 mL via INTRAVENOUS
  Administered 2020-07-22: 1.5 mg via INTRAVENOUS
  Administered 2020-07-22: 3 mg via INTRAVENOUS
  Administered 2020-07-22: 10.5 mg via INTRAVENOUS
  Filled 2020-07-20 (×4): qty 30

## 2020-07-20 MED ORDER — LACTATED RINGERS IV BOLUS
2000.0000 mL | Freq: Once | INTRAVENOUS | Status: AC
  Administered 2020-07-20: 2000 mL via INTRAVENOUS

## 2020-07-20 MED ORDER — ONDANSETRON HCL 4 MG/2ML IJ SOLN: 4.0000 mg | Freq: Four times a day (QID) | INTRAMUSCULAR | Status: DC | PRN

## 2020-07-20 MED ORDER — SODIUM CHLORIDE 0.9% FLUSH: 9.0000 mL | INTRAVENOUS | Status: DC | PRN

## 2020-07-20 MED ORDER — MORPHINE SULFATE (PF) 2 MG/ML IV SOLN
2.0000 mg | INTRAVENOUS | Status: DC | PRN
Start: 2020-07-20 — End: 2020-07-20
  Administered 2020-07-20: 2 mg via INTRAVENOUS
  Filled 2020-07-20: qty 1
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
## 2020-07-20 NOTE — Progress Notes (Signed)
On call provider MD Mansy textpaged and made aware  pt. HR going to 135-140's and c/o of RUQ abdominal pain, IV pain med not due yet. received new orders.

## 2020-07-20 NOTE — Progress Notes (Signed)
Paged provider corrected lipase 433  Provider responded message recieved
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
## 2020-07-20 NOTE — Progress Notes (Signed)
New Admission Note:   Arrival Method: via stretcher from ED Mental Orientation: alert and oriented x4 Telemetry: 52m06, CCMD notified Assessment: to be completed Skin: Intact IV: RAC, nsl Pain: 6/10, pain med was given earlier Tubes: None Safety Measures: Safety Fall Prevention Plan has been discussed  Admission: to be completed 5 [place] Orientation: Patient has been oriented to the room, unit and staff.   Family: none at bedside Orders to be reviewed and implemented. Will continue to monitor the patient. Call light has been placed within reach and bed alarm has been activated.

## 2020-07-21 ENCOUNTER — Inpatient Hospital Stay (HOSPITAL_COMMUNITY): Payer: 59

## 2020-07-21 DIAGNOSIS — K851 Biliary acute pancreatitis without necrosis or infection: Secondary | ICD-10-CM | POA: Diagnosis not present

## 2020-07-21 DIAGNOSIS — R9431 Abnormal electrocardiogram [ECG] [EKG]: Secondary | ICD-10-CM

## 2020-07-21 LAB — ECHOCARDIOGRAM LIMITED
Calc EF: 58.3 %
Height: 70 in
S' Lateral: 2.5 cm
Single Plane A2C EF: 62.3 %
Single Plane A4C EF: 51.4 %
Weight: 2912 oz

## 2020-07-21 LAB — COMPREHENSIVE METABOLIC PANEL
ALT: 98 U/L — ABNORMAL HIGH (ref 0–44)
AST: 62 U/L — ABNORMAL HIGH (ref 15–41)
Albumin: 1.9 g/dL — ABNORMAL LOW (ref 3.5–5.0)
Alkaline Phosphatase: 140 U/L — ABNORMAL HIGH (ref 38–126)
Anion gap: 14 (ref 5–15)
BUN: 8 mg/dL (ref 6–20)
CO2: 20 mmol/L — ABNORMAL LOW (ref 22–32)
Calcium: 9 mg/dL (ref 8.9–10.3)
Chloride: 104 mmol/L (ref 98–111)
Creatinine, Ser: 1.02 mg/dL (ref 0.61–1.24)
GFR, Estimated: >60 mL/min (ref >60)
Glucose, Bld: 103 mg/dL — ABNORMAL HIGH (ref 70–99)
Potassium: 3.4 mmol/L — ABNORMAL LOW (ref 3.5–5.1)
Sodium: 138 mmol/L (ref 135–145)
Total Bilirubin: 7.7 mg/dL — ABNORMAL HIGH (ref 0.3–1.2)
Total Protein: 5.6 g/dL — ABNORMAL LOW (ref 6.5–8.1)

## 2020-07-21 LAB — CBC WITH DIFFERENTIAL/PLATELET
Abs Immature Granulocytes: 0.47 10*3/uL — ABNORMAL HIGH (ref 0.00–0.07)
Basophils Absolute: 0.1 10*3/uL (ref 0.0–0.1)
Basophils Relative: 0 %
Eosinophils Absolute: 0 10*3/uL (ref 0.0–0.5)
Eosinophils Relative: 0 %
HCT: 35.7 % — ABNORMAL LOW (ref 39.0–52.0)
Hemoglobin: 11.6 g/dL — ABNORMAL LOW (ref 13.0–17.0)
Immature Granulocytes: 2 %
Lymphocytes Relative: 5 %
Lymphs Abs: 1.3 10*3/uL (ref 0.7–4.0)
MCH: 28.8 pg (ref 26.0–34.0)
MCHC: 32.5 g/dL (ref 30.0–36.0)
MCV: 88.6 fL (ref 80.0–100.0)
Monocytes Absolute: 2 10*3/uL — ABNORMAL HIGH (ref 0.1–1.0)
Monocytes Relative: 7 %
Neutro Abs: 24.4 10*3/uL — ABNORMAL HIGH (ref 1.7–7.7)
Neutrophils Relative %: 86 %
Platelets: 408 10*3/uL — ABNORMAL HIGH (ref 150–400)
RBC: 4.03 MIL/uL — ABNORMAL LOW (ref 4.22–5.81)
RDW: 17.1 % — ABNORMAL HIGH (ref 11.5–15.5)
WBC Morphology: INCREASED
WBC: 28.2 10*3/uL — ABNORMAL HIGH (ref 4.0–10.5)
nRBC: 0 % (ref 0.0–0.2)

## 2020-07-21 LAB — PHOSPHORUS: Phosphorus: 1.9 mg/dL — ABNORMAL LOW (ref 2.5–4.6)

## 2020-07-21 LAB — GLUCOSE, CAPILLARY
Glucose-Capillary: 101 mg/dL — ABNORMAL HIGH (ref 70–99)
Glucose-Capillary: 106 mg/dL — ABNORMAL HIGH (ref 70–99)
Glucose-Capillary: 113 mg/dL — ABNORMAL HIGH (ref 70–99)
Glucose-Capillary: 118 mg/dL — ABNORMAL HIGH (ref 70–99)
Glucose-Capillary: 152 mg/dL — ABNORMAL HIGH (ref 70–99)
Glucose-Capillary: 177 mg/dL — ABNORMAL HIGH (ref 70–99)
Glucose-Capillary: 97 mg/dL (ref 70–99)

## 2020-07-21 LAB — LIPASE, BLOOD: Lipase: 111 U/L — ABNORMAL HIGH (ref 11–51)

## 2020-07-21 LAB — MAGNESIUM: Magnesium: 1.7 mg/dL (ref 1.7–2.4)

## 2020-07-21 LAB — PROCALCITONIN: Procalcitonin: 5.2 ng/mL

## 2020-07-21 MED ORDER — METOPROLOL TARTRATE 5 MG/5ML IV SOLN
5.0000 mg | Freq: Once | INTRAVENOUS | Status: AC
  Administered 2020-07-21: 5 mg via INTRAVENOUS

## 2020-07-21 MED ORDER — METOPROLOL TARTRATE 5 MG/5ML IV SOLN
2.5000 mg | Freq: Once | INTRAVENOUS | Status: AC
  Administered 2020-07-21: 2.5 mg via INTRAVENOUS
  Filled 2020-07-21: qty 5

## 2020-07-21 MED ORDER — MORPHINE SULFATE (PF) 2 MG/ML IV SOLN: 2.0000 mg | Freq: Once | INTRAVENOUS | Status: DC

## 2020-07-21 MED ORDER — POTASSIUM PHOSPHATES 15 MMOLE/5ML IV SOLN
10.0000 mmol | Freq: Once | INTRAVENOUS | Status: AC
  Administered 2020-07-21: 10 mmol via INTRAVENOUS
  Filled 2020-07-21: qty 3.33

## 2020-07-21 MED ORDER — METOPROLOL TARTRATE 5 MG/5ML IV SOLN
5.0000 mg | Freq: Four times a day (QID) | INTRAVENOUS | Status: DC | PRN
  Administered 2020-07-21 – 2020-07-22 (×3): 5 mg via INTRAVENOUS
  Filled 2020-07-21 (×4): qty 5

## 2020-07-21 MED ORDER — IOHEXOL 350 MG/ML SOLN
100.0000 mL | Freq: Once | INTRAVENOUS | Status: AC | PRN
  Administered 2020-07-21: 100 mL via INTRAVENOUS
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
## 2020-07-21 NOTE — Plan of Care (Signed)
  Problem: Pain Managment: Goal: General experience of comfort will improve Outcome: Progressing   Problem: Clinical Measurements: Goal: Ability to maintain clinical measurements within normal limits will improve Outcome: Progressing   Problem: Education: Goal: Knowledge of General Education information will improve Description Including pain rating scale, medication(s)/side effects and non-pharmacologic comfort measures Outcome: Progressing   

## 2020-07-21 NOTE — Progress Notes (Signed)
  Echocardiogram 2D Echocardiogram has been performed.  Janalyn Harder 07/21/2020, 4:04 PM

## 2020-07-21 NOTE — Clinical Note (Incomplete)
Paged Donia Ast  Regarding MRI scheduled and patient not able to do per his statement.
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
## 2020-07-21 NOTE — Progress Notes (Signed)
   07/21/20 0748  Vitals  BP 128/83  MAP (mmHg) 95  BP Location Left Arm  BP Method Automatic  Patient Position (if appropriate) Lying  Pulse Rate (!) 146  Pulse Rate Source Dinamap  Resp 19  Level of Consciousness  Level of Consciousness Alert  MEWS COLOR  MEWS Score Color Yellow  MEWS Score  MEWS Temp 0  MEWS Systolic 0  MEWS Pulse 3  MEWS RR 0  MEWS LOC 0  MEWS Score 3  Pageed provider regarding heart rate

## 2020-07-22 ENCOUNTER — Inpatient Hospital Stay (HOSPITAL_COMMUNITY): Payer: 59

## 2020-07-22 DIAGNOSIS — K851 Biliary acute pancreatitis without necrosis or infection: Secondary | ICD-10-CM | POA: Diagnosis not present

## 2020-07-22 HISTORY — PX: IR US GUIDANCE: IMG2393

## 2020-07-22 HISTORY — PX: IR PERC CHOLECYSTOSTOMY: IMG2326

## 2020-07-22 LAB — COMPREHENSIVE METABOLIC PANEL
ALT: 66 U/L — ABNORMAL HIGH (ref 0–44)
AST: 39 U/L (ref 15–41)
Albumin: 1.7 g/dL — ABNORMAL LOW (ref 3.5–5.0)
Alkaline Phosphatase: 131 U/L — ABNORMAL HIGH (ref 38–126)
Anion gap: 16 — ABNORMAL HIGH (ref 5–15)
BUN: 9 mg/dL (ref 6–20)
CO2: 18 mmol/L — ABNORMAL LOW (ref 22–32)
Calcium: 9.1 mg/dL (ref 8.9–10.3)
Chloride: 104 mmol/L (ref 98–111)
Creatinine, Ser: 1.18 mg/dL (ref 0.61–1.24)
GFR, Estimated: >60 mL/min (ref >60)
Glucose, Bld: 99 mg/dL (ref 70–99)
Potassium: 3.1 mmol/L — ABNORMAL LOW (ref 3.5–5.1)
Sodium: 138 mmol/L (ref 135–145)
Total Bilirubin: 10.8 mg/dL — ABNORMAL HIGH (ref 0.3–1.2)
Total Protein: 5.6 g/dL — ABNORMAL LOW (ref 6.5–8.1)

## 2020-07-22 LAB — CBC WITH DIFFERENTIAL/PLATELET
Abs Immature Granulocytes: 0 10*3/uL (ref 0.00–0.07)
Basophils Absolute: 0 10*3/uL (ref 0.0–0.1)
Basophils Relative: 0 %
Eosinophils Absolute: 0 10*3/uL (ref 0.0–0.5)
Eosinophils Relative: 0 %
HCT: 30.4 % — ABNORMAL LOW (ref 39.0–52.0)
Hemoglobin: 9.7 g/dL — ABNORMAL LOW (ref 13.0–17.0)
Lymphocytes Relative: 3 %
Lymphs Abs: 0.8 10*3/uL (ref 0.7–4.0)
MCH: 28.7 pg (ref 26.0–34.0)
MCHC: 31.9 g/dL (ref 30.0–36.0)
MCV: 89.9 fL (ref 80.0–100.0)
Monocytes Absolute: 1.6 10*3/uL — ABNORMAL HIGH (ref 0.1–1.0)
Monocytes Relative: 6 %
Neutro Abs: 24.2 10*3/uL — ABNORMAL HIGH (ref 1.7–7.7)
Neutrophils Relative %: 91 %
Platelets: 412 10*3/uL — ABNORMAL HIGH (ref 150–400)
RBC: 3.38 MIL/uL — ABNORMAL LOW (ref 4.22–5.81)
RDW: 17.6 % — ABNORMAL HIGH (ref 11.5–15.5)
WBC: 26.6 10*3/uL — ABNORMAL HIGH (ref 4.0–10.5)
nRBC: 0 % (ref 0.0–0.2)
nRBC: 0 /100 WBC

## 2020-07-22 LAB — GLUCOSE, CAPILLARY
Glucose-Capillary: 108 mg/dL — ABNORMAL HIGH (ref 70–99)
Glucose-Capillary: 114 mg/dL — ABNORMAL HIGH (ref 70–99)
Glucose-Capillary: 123 mg/dL — ABNORMAL HIGH (ref 70–99)
Glucose-Capillary: 126 mg/dL — ABNORMAL HIGH (ref 70–99)
Glucose-Capillary: 129 mg/dL — ABNORMAL HIGH (ref 70–99)
Glucose-Capillary: 57 mg/dL — ABNORMAL LOW (ref 70–99)
Glucose-Capillary: 70 mg/dL (ref 70–99)

## 2020-07-22 LAB — T4, FREE: Free T4: 1.02 ng/dL (ref 0.61–1.12)

## 2020-07-22 LAB — LIPASE, BLOOD: Lipase: 38 U/L (ref 11–51)

## 2020-07-22 LAB — MAGNESIUM: Magnesium: 1.8 mg/dL (ref 1.7–2.4)

## 2020-07-22 LAB — TSH: TSH: 0.305 u[IU]/mL — ABNORMAL LOW (ref 0.350–4.500)

## 2020-07-22 MED ORDER — FENTANYL CITRATE (PF) 100 MCG/2ML IJ SOLN
INTRAMUSCULAR | Status: AC
  Filled 2020-07-22: qty 2

## 2020-07-22 MED ORDER — DEXTROSE 50 % IV SOLN: 25.0000 g | Freq: Once | INTRAVENOUS | Status: AC

## 2020-07-22 MED ORDER — SODIUM CHLORIDE 0.9% FLUSH: 9.0000 mL | INTRAVENOUS | Status: DC | PRN

## 2020-07-22 MED ORDER — MORPHINE SULFATE 1 MG/ML IV SOLN PCA
INTRAVENOUS | Status: DC
  Administered 2020-07-22: 11 mg via INTRAVENOUS
  Administered 2020-07-22: 3 mg via INTRAVENOUS
  Administered 2020-07-22: 7 mg via INTRAVENOUS
  Administered 2020-07-23: 3 mg via INTRAVENOUS
  Administered 2020-07-23: 0 mg via INTRAVENOUS
  Administered 2020-07-23: 4 mg via INTRAVENOUS
  Administered 2020-07-23: 8 mg via INTRAVENOUS
  Administered 2020-07-23: 3 mg via INTRAVENOUS
  Administered 2020-07-24: 7 mg via INTRAVENOUS
  Administered 2020-07-24: 6 mg via INTRAVENOUS
  Filled 2020-07-22 (×3): qty 30

## 2020-07-22 MED ORDER — MIDAZOLAM HCL 2 MG/2ML IJ SOLN
INTRAMUSCULAR | Status: AC | PRN
Start: 2020-07-22 — End: 2020-07-22
  Administered 2020-07-22: 1 mg via INTRAVENOUS

## 2020-07-22 MED ORDER — LIDOCAINE HCL 1 % IJ SOLN
INTRAMUSCULAR | Status: AC
  Filled 2020-07-22: qty 20

## 2020-07-22 MED ORDER — INSULIN ASPART 100 UNIT/ML ~~LOC~~ SOLN
0.0000 [IU] | SUBCUTANEOUS | Status: DC
  Administered 2020-07-22 – 2020-07-23 (×4): 1 [IU] via SUBCUTANEOUS
  Administered 2020-07-23: 2 [IU] via SUBCUTANEOUS

## 2020-07-22 MED ORDER — DIPHENHYDRAMINE HCL 12.5 MG/5ML PO ELIX: 12.5000 mg | ORAL_SOLUTION | Freq: Four times a day (QID) | ORAL | Status: DC | PRN

## 2020-07-22 MED ORDER — DIPHENHYDRAMINE HCL 50 MG/ML IJ SOLN: 12.5000 mg | Freq: Four times a day (QID) | INTRAMUSCULAR | Status: DC | PRN

## 2020-07-22 MED ORDER — DEXTROSE IN LACTATED RINGERS 5 % IV SOLN
INTRAVENOUS | Status: DC
  Administered 2020-07-25: 125 mL/h via INTRAVENOUS

## 2020-07-22 MED ORDER — DEXTROSE 50 % IV SOLN
INTRAVENOUS | Status: AC
  Administered 2020-07-22: 50 mL
  Filled 2020-07-22: qty 50

## 2020-07-22 MED ORDER — IOHEXOL 300 MG/ML  SOLN
50.0000 mL | Freq: Once | INTRAMUSCULAR | Status: AC | PRN
  Administered 2020-07-22: 10 mL

## 2020-07-22 MED ORDER — POTASSIUM CHLORIDE 10 MEQ/100ML IV SOLN
10.0000 meq | INTRAVENOUS | Status: AC
  Administered 2020-07-22 (×2): 10 meq via INTRAVENOUS

## 2020-07-22 MED ORDER — FENTANYL CITRATE (PF) 100 MCG/2ML IJ SOLN
INTRAMUSCULAR | Status: AC | PRN
  Administered 2020-07-22: 50 ug via INTRAVENOUS

## 2020-07-22 MED ORDER — MIDAZOLAM HCL 2 MG/2ML IJ SOLN
INTRAMUSCULAR | Status: AC
  Filled 2020-07-22: qty 2

## 2020-07-22 MED ORDER — POTASSIUM CHLORIDE 10 MEQ/100ML IV SOLN
10.0000 meq | INTRAVENOUS | Status: DC
  Administered 2020-07-22 (×3): 10 meq via INTRAVENOUS
  Filled 2020-07-22 (×5): qty 100

## 2020-07-22 MED ORDER — POTASSIUM CHLORIDE 10 MEQ/100ML IV SOLN: 10.0000 meq | INTRAVENOUS | Status: DC

## 2020-07-22 MED ORDER — ONDANSETRON HCL 4 MG/2ML IJ SOLN: 4.0000 mg | Freq: Four times a day (QID) | INTRAMUSCULAR | Status: DC | PRN

## 2020-07-22 MED ORDER — SODIUM CHLORIDE 0.9% FLUSH
5.0000 mL | Freq: Three times a day (TID) | INTRAVENOUS | Status: DC
  Administered 2020-07-22 – 2020-08-01 (×28): 5 mL

## 2020-07-22 MED ORDER — LIDOCAINE HCL 1 % IJ SOLN
INTRAMUSCULAR | Status: AC | PRN
  Administered 2020-07-22: 10 mL

## 2020-07-22 MED ORDER — NALOXONE HCL 0.4 MG/ML IJ SOLN: 0.4000 mg | INTRAMUSCULAR | Status: DC | PRN

## 2020-07-22 NOTE — Progress Notes (Signed)
Spoke with wife at length about pt's biliary drain placement and general discussion about color of current drainage (bloody since procedure just done) and what the drainage generally will look like (bile-dark green).  Wife asked if he would go home with drain, she stated PA had told her he would go home with the drain for a period of time.  Briefly discussed with her about care of drain, and would know more when surgeon gave instructions on how often to empty and if it would need to be flushed. Reassured wife she would be educated on care once surgeon decided plan of care.

## 2020-07-22 NOTE — Progress Notes (Addendum)
Hypoglycemic Event  CBG: 57  Treatment: D50 50 mL (25 gm)  Symptoms: None  Follow-up CBG: Time: CBG Result  Possible Reasons for Event: Other: NPO  Comments/MD notified:Dr Patel notified of glucose, pt given 25gm dextrose IVP and IVF changed to D5LR per MD.  Pt going to IR for drain placement.   IR staff notified. (spoke with Christina in IR and informed her of medication given and IVF change for low glucose and requested her to give information to the RN in IR department.  Also inquired if they could reassess his CBG in IR and she stated they could).  (spoke with other staff member about glucose level when they called stating pt to be picked up to go to IR).     Buford Dresser, Maribell Demeo E

## 2020-07-22 NOTE — Procedures (Signed)
Interventional Radiology Procedure Note  Procedure: Image guided drain placement, perc chole.  10F pigtail drain.  Complications: None  EBL: None Sample: Culture sent  Recommendations: - Routine drain care, with sterile flushes, record output - follow up Cx - routine wound care  Signed,  Anely Spiewak S. Rami Budhu, DO    
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
## 2020-07-23 DIAGNOSIS — K851 Biliary acute pancreatitis without necrosis or infection: Secondary | ICD-10-CM | POA: Diagnosis not present

## 2020-07-23 LAB — COMPREHENSIVE METABOLIC PANEL
ALT: 49 U/L — ABNORMAL HIGH (ref 0–44)
AST: 37 U/L (ref 15–41)
Albumin: 1.6 g/dL — ABNORMAL LOW (ref 3.5–5.0)
Alkaline Phosphatase: 124 U/L (ref 38–126)
Anion gap: 12 (ref 5–15)
BUN: 9 mg/dL (ref 6–20)
CO2: 20 mmol/L — ABNORMAL LOW (ref 22–32)
Calcium: 8.9 mg/dL (ref 8.9–10.3)
Chloride: 102 mmol/L (ref 98–111)
Creatinine, Ser: 0.8 mg/dL (ref 0.61–1.24)
GFR, Estimated: >60 mL/min (ref >60)
Glucose, Bld: 140 mg/dL — ABNORMAL HIGH (ref 70–99)
Potassium: 4.2 mmol/L (ref 3.5–5.1)
Sodium: 134 mmol/L — ABNORMAL LOW (ref 135–145)
Total Bilirubin: 7.3 mg/dL — ABNORMAL HIGH (ref 0.3–1.2)
Total Protein: 5.5 g/dL — ABNORMAL LOW (ref 6.5–8.1)

## 2020-07-23 LAB — LIPASE, BLOOD: Lipase: 29 U/L (ref 11–51)

## 2020-07-23 LAB — CBC WITH DIFFERENTIAL/PLATELET
Abs Immature Granulocytes: 0 10*3/uL (ref 0.00–0.07)
Basophils Absolute: 0.2 10*3/uL — ABNORMAL HIGH (ref 0.0–0.1)
Basophils Relative: 1 %
Eosinophils Absolute: 0 10*3/uL (ref 0.0–0.5)
Eosinophils Relative: 0 %
HCT: 25.5 % — ABNORMAL LOW (ref 39.0–52.0)
Hemoglobin: 8.4 g/dL — ABNORMAL LOW (ref 13.0–17.0)
Lymphocytes Relative: 6 %
Lymphs Abs: 1.5 10*3/uL (ref 0.7–4.0)
MCH: 28.9 pg (ref 26.0–34.0)
MCHC: 32.9 g/dL (ref 30.0–36.0)
MCV: 87.6 fL (ref 80.0–100.0)
Monocytes Absolute: 1 10*3/uL (ref 0.1–1.0)
Monocytes Relative: 4 %
Neutro Abs: 21.9 10*3/uL — ABNORMAL HIGH (ref 1.7–7.7)
Neutrophils Relative %: 89 %
Platelets: 356 10*3/uL (ref 150–400)
RBC: 2.91 MIL/uL — ABNORMAL LOW (ref 4.22–5.81)
RDW: 17.5 % — ABNORMAL HIGH (ref 11.5–15.5)
WBC: 24.6 10*3/uL — ABNORMAL HIGH (ref 4.0–10.5)
nRBC: 0 % (ref 0.0–0.2)
nRBC: 0 /100 WBC

## 2020-07-23 LAB — GLUCOSE, CAPILLARY
Glucose-Capillary: 144 mg/dL — ABNORMAL HIGH (ref 70–99)
Glucose-Capillary: 144 mg/dL — ABNORMAL HIGH (ref 70–99)
Glucose-Capillary: 149 mg/dL — ABNORMAL HIGH (ref 70–99)
Glucose-Capillary: 152 mg/dL — ABNORMAL HIGH (ref 70–99)
Glucose-Capillary: 167 mg/dL — ABNORMAL HIGH (ref 70–99)
Glucose-Capillary: 190 mg/dL — ABNORMAL HIGH (ref 70–99)

## 2020-07-23 LAB — MAGNESIUM: Magnesium: 1.7 mg/dL (ref 1.7–2.4)

## 2020-07-23 MED ORDER — DOCUSATE SODIUM 100 MG PO CAPS
100.0000 mg | ORAL_CAPSULE | Freq: Every day | ORAL | Status: DC
  Administered 2020-07-23 – 2020-07-26 (×3): 100 mg via ORAL
  Filled 2020-07-23 (×4): qty 1

## 2020-07-23 MED ORDER — POLYETHYLENE GLYCOL 3350 17 G PO PACK
17.0000 g | PACK | Freq: Every day | ORAL | Status: DC
  Administered 2020-07-23 – 2020-08-01 (×8): 17 g via ORAL
  Filled 2020-07-23 (×9): qty 1

## 2020-07-23 MED ORDER — ENOXAPARIN SODIUM 40 MG/0.4ML ~~LOC~~ SOLN
40.0000 mg | SUBCUTANEOUS | Status: DC
  Administered 2020-07-23: 40 mg via SUBCUTANEOUS
  Filled 2020-07-23: qty 0.4
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
## 2020-07-23 NOTE — Plan of Care (Signed)

## 2020-07-23 NOTE — Plan of Care (Signed)
  Problem: Education: Goal: Knowledge of General Education information will improve Description: Including pain rating scale, medication(s)/side effects and non-pharmacologic comfort measures Outcome: Progressing   Problem: Health Behavior/Discharge Planning: Goal: Ability to manage health-related needs will improve Outcome: Progressing   Problem: Activity: Goal: Risk for activity intolerance will decrease Outcome: Progressing   

## 2020-07-24 ENCOUNTER — Inpatient Hospital Stay (HOSPITAL_COMMUNITY): Payer: 59

## 2020-07-24 DIAGNOSIS — K851 Biliary acute pancreatitis without necrosis or infection: Secondary | ICD-10-CM | POA: Diagnosis not present

## 2020-07-24 LAB — CULTURE, BLOOD (ROUTINE X 2)
Culture: NO GROWTH
Culture: NO GROWTH

## 2020-07-24 LAB — GLUCOSE, CAPILLARY
Glucose-Capillary: 167 mg/dL — ABNORMAL HIGH (ref 70–99)
Glucose-Capillary: 143 mg/dL — ABNORMAL HIGH (ref 70–99)
Glucose-Capillary: 111 mg/dL — ABNORMAL HIGH (ref 70–99)
Glucose-Capillary: 106 mg/dL — ABNORMAL HIGH (ref 70–99)
Glucose-Capillary: 118 mg/dL — ABNORMAL HIGH (ref 70–99)
Glucose-Capillary: 154 mg/dL — ABNORMAL HIGH (ref 70–99)
Glucose-Capillary: 188 mg/dL — ABNORMAL HIGH (ref 70–99)

## 2020-07-24 LAB — RETICULOCYTES
Immature Retic Fract: 24.2 % — ABNORMAL HIGH (ref 2.3–15.9)
RBC.: 2.78 MIL/uL — ABNORMAL LOW (ref 4.22–5.81)
Retic Count, Absolute: 40 10*3/uL (ref 19.0–186.0)
Retic Ct Pct: 1.4 % (ref 0.4–3.1)

## 2020-07-24 LAB — BASIC METABOLIC PANEL
Anion gap: 11 (ref 5–15)
BUN: 7 mg/dL (ref 6–20)
CO2: 25 mmol/L (ref 22–32)
Calcium: 9.2 mg/dL (ref 8.9–10.3)
Chloride: 97 mmol/L — ABNORMAL LOW (ref 98–111)
Creatinine, Ser: 0.65 mg/dL (ref 0.61–1.24)
GFR, Estimated: >60 mL/min (ref >60)
Glucose, Bld: 129 mg/dL — ABNORMAL HIGH (ref 70–99)
Potassium: 3.3 mmol/L — ABNORMAL LOW (ref 3.5–5.1)
Sodium: 133 mmol/L — ABNORMAL LOW (ref 135–145)

## 2020-07-24 LAB — COMPREHENSIVE METABOLIC PANEL
ALT: 38 U/L (ref 0–44)
AST: 29 U/L (ref 15–41)
Albumin: 1.5 g/dL — ABNORMAL LOW (ref 3.5–5.0)
Alkaline Phosphatase: 117 U/L (ref 38–126)
Anion gap: 12 (ref 5–15)
BUN: 7 mg/dL (ref 6–20)
CO2: 24 mmol/L (ref 22–32)
Calcium: 9 mg/dL (ref 8.9–10.3)
Chloride: 100 mmol/L (ref 98–111)
Creatinine, Ser: 0.68 mg/dL (ref 0.61–1.24)
GFR, Estimated: >60 mL/min (ref >60)
Glucose, Bld: 168 mg/dL — ABNORMAL HIGH (ref 70–99)
Potassium: 2.9 mmol/L — ABNORMAL LOW (ref 3.5–5.1)
Sodium: 136 mmol/L (ref 135–145)
Total Bilirubin: 4.6 mg/dL — ABNORMAL HIGH (ref 0.3–1.2)
Total Protein: 5.3 g/dL — ABNORMAL LOW (ref 6.5–8.1)

## 2020-07-24 LAB — CBC WITH DIFFERENTIAL/PLATELET
Abs Immature Granulocytes: 1.02 10*3/uL — ABNORMAL HIGH (ref 0.00–0.07)
Basophils Absolute: 0.1 10*3/uL (ref 0.0–0.1)
Basophils Relative: 0 %
Eosinophils Absolute: 0.1 10*3/uL (ref 0.0–0.5)
Eosinophils Relative: 0 %
HCT: 22.7 % — ABNORMAL LOW (ref 39.0–52.0)
Hemoglobin: 7.4 g/dL — ABNORMAL LOW (ref 13.0–17.0)
Immature Granulocytes: 4 %
Lymphocytes Relative: 11 %
Lymphs Abs: 2.7 10*3/uL (ref 0.7–4.0)
MCH: 28.6 pg (ref 26.0–34.0)
MCHC: 32.6 g/dL (ref 30.0–36.0)
MCV: 87.6 fL (ref 80.0–100.0)
Monocytes Absolute: 1.6 10*3/uL — ABNORMAL HIGH (ref 0.1–1.0)
Monocytes Relative: 7 %
Neutro Abs: 18.3 10*3/uL — ABNORMAL HIGH (ref 1.7–7.7)
Neutrophils Relative %: 78 %
Platelets: 374 10*3/uL (ref 150–400)
RBC: 2.59 MIL/uL — ABNORMAL LOW (ref 4.22–5.81)
RDW: 17.3 % — ABNORMAL HIGH (ref 11.5–15.5)
WBC: 23.8 10*3/uL — ABNORMAL HIGH (ref 4.0–10.5)
nRBC: 0 % (ref 0.0–0.2)

## 2020-07-24 LAB — CBC
HCT: 24.2 % — ABNORMAL LOW (ref 39.0–52.0)
Hemoglobin: 8.3 g/dL — ABNORMAL LOW (ref 13.0–17.0)
MCH: 29.7 pg (ref 26.0–34.0)
MCHC: 34.3 g/dL (ref 30.0–36.0)
MCV: 86.7 fL (ref 80.0–100.0)
Platelets: 441 10*3/uL — ABNORMAL HIGH (ref 150–400)
RBC: 2.79 MIL/uL — ABNORMAL LOW (ref 4.22–5.81)
RDW: 17.2 % — ABNORMAL HIGH (ref 11.5–15.5)
WBC: 24.8 10*3/uL — ABNORMAL HIGH (ref 4.0–10.5)
nRBC: 0 % (ref 0.0–0.2)

## 2020-07-24 LAB — MAGNESIUM
Magnesium: 1.6 mg/dL — ABNORMAL LOW (ref 1.7–2.4)
Magnesium: 1.8 mg/dL (ref 1.7–2.4)

## 2020-07-24 LAB — LIPASE, BLOOD: Lipase: 85 U/L — ABNORMAL HIGH (ref 11–51)

## 2020-07-24 MED ORDER — SODIUM CHLORIDE 0.9 % IV BOLUS
500.0000 mL | Freq: Once | INTRAVENOUS | Status: AC
  Administered 2020-07-24: 500 mL via INTRAVENOUS

## 2020-07-24 MED ORDER — MORPHINE SULFATE 1 MG/ML IV SOLN PCA
INTRAVENOUS | Status: DC
Start: 2020-07-24 — End: 2020-07-26
  Administered 2020-07-26: 10.5 mg via INTRAVENOUS
  Filled 2020-07-24 (×5): qty 30

## 2020-07-24 MED ORDER — POTASSIUM CHLORIDE CRYS ER 20 MEQ PO TBCR
40.0000 meq | EXTENDED_RELEASE_TABLET | Freq: Once | ORAL | Status: AC
  Administered 2020-07-24: 40 meq via ORAL
  Filled 2020-07-24: qty 2

## 2020-07-24 MED ORDER — POTASSIUM CHLORIDE 10 MEQ/100ML IV SOLN
10.0000 meq | INTRAVENOUS | Status: AC
  Administered 2020-07-24 (×3): 10 meq via INTRAVENOUS
  Filled 2020-07-24 (×3): qty 100

## 2020-07-24 MED ORDER — HYDROMORPHONE HCL 1 MG/ML IJ SOLN
1.0000 mg | Freq: Once | INTRAMUSCULAR | Status: AC | PRN
Start: 2020-07-24 — End: 2020-07-24
  Administered 2020-07-24: 1 mg via INTRAVENOUS
  Filled 2020-07-24: qty 1

## 2020-07-24 MED ORDER — METOCLOPRAMIDE HCL 5 MG/ML IJ SOLN
5.0000 mg | Freq: Three times a day (TID) | INTRAMUSCULAR | Status: DC
  Administered 2020-07-24 – 2020-07-26 (×6): 5 mg via INTRAVENOUS
  Filled 2020-07-24 (×6): qty 2

## 2020-07-24 MED ORDER — IOHEXOL 350 MG/ML SOLN
100.0000 mL | Freq: Once | INTRAVENOUS | Status: AC | PRN
  Administered 2020-07-24: 100 mL via INTRAVENOUS

## 2020-07-24 MED ORDER — LACTATED RINGERS IV SOLN: INTRAVENOUS | Status: DC

## 2020-07-24 MED ORDER — MAGNESIUM SULFATE 2 GM/50ML IV SOLN
2.0000 g | Freq: Once | INTRAVENOUS | Status: AC
  Administered 2020-07-24: 2 g via INTRAVENOUS
  Filled 2020-07-24: qty 50
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
## 2020-07-24 NOTE — Progress Notes (Signed)
MD notified pt current K+=2.9 (4.2 yesterday), H/H=7.4/22.7 (down from yesterday 8.4/25.5)

## 2020-07-24 NOTE — Progress Notes (Signed)
Pt c/o 10/10 and has a swollen area on his back above his drain site. Pt stating he feels horrible and wants to see MD immediately. MD paged.
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
## 2020-07-24 NOTE — Progress Notes (Signed)
   07/24/20 0700  Assess: MEWS Score  Pulse Rate (!) 134  Resp 16  Level of Consciousness Alert  SpO2 96 %  O2 Device Nasal Cannula  O2 Flow Rate (L/min) 1 L/min  Assess: MEWS Score  MEWS Temp 0  MEWS Systolic 0  MEWS Pulse 3  MEWS RR 0  MEWS LOC 0  MEWS Score 3  MEWS Score Color Yellow  Treat  Pain Score 8  Faces Pain Scale 8  Pain Type Acute pain  Pain Location Abdomen  Pain Orientation Right;Upper  Pain Descriptors / Indicators Aching  Pain Frequency Constant  Pain Onset On-going  Patients Stated Pain Goal 0  Multiple Pain Sites No  Breathing 0  Negative Vocalization 1  Facial Expression 2  Body Language 2  Consolability 1  PAINAD Score 6  Take Vital Signs  Increase Vital Sign Frequency  Yellow: Q 2hr X 2 then Q 4hr X 2, if remains yellow, continue Q 4hrs  Escalate  MEWS: Escalate Yellow: discuss with charge nurse/RN and consider discussing with provider and RRT  Notify: Charge Nurse/RN  Name of Charge Nurse/RN Notified Charito  Date Charge Nurse/RN Notified 07/24/20  Time Charge Nurse/RN Notified 0730  Notify: Provider  Provider Name/Title Dr .Leodis Binet  Date Provider Notified 07/24/20  Time Provider Notified  (M.D. already at [facility],notified earlier by [facility] nurse.)  Notification Type Call  Notification Reason Change in status  Provider response At bedside  Date of Provider Response 07/24/20  Time of Provider Response 0730  Document  Patient Outcome Stabilized after interventions

## 2020-07-24 NOTE — Progress Notes (Signed)
Subjective: Upper abdominal pain and some back pain.  Objective: Vital signs in last 24 hours: Temp:  [97.5 F (36.4 C)-98.4 F (36.9 C)] 98.2 F (36.8 C) (03/26 0650) Pulse Rate:  [102-111] 108 (03/26 0650) Resp:  [16-22] 16 (03/26 0650) BP: (143-153)/(92-100) 146/94 (03/26 0650) SpO2:  [96 %-98 %] 98 % (03/26 0650) FiO2 (%):  [0 %] 0 % (03/25 1600) Last BM Date: 07/23/20  Intake/Output from previous day: 03/25 0701 - 03/26 0700 In: 2591.1 [P.O.:720; I.V.:1706.1; IV Piggyback:150] Out: 3325 [Urine:3075; Drains:250] Intake/Output this shift: No intake/output data recorded.  General appearance: restless and uncomfortable Resp: clear to auscultation bilaterally Cardio: RRR tachycardic GI: tender in the upper abdomen, bloody drainage in the percutaneous bag Extremities: extremities normal, atraumatic, no cyanosis or edema  Lab Results: Recent Labs    07/22/20 0617 07/23/20 0554 07/24/20 0301  WBC 26.6* 24.6* 23.8*  HGB 9.7* 8.4* 7.4*  HCT 30.4* 25.5* 22.7*  PLT 412* 356 374   BMET Recent Labs    07/22/20 0617 07/23/20 0554 07/24/20 0301  NA 138 134* 136  K 3.1* 4.2 2.9*  CL 104 102 100  CO2 18* 20* 24  GLUCOSE 99 140* 168*  BUN 9 9 7   CREATININE 1.18 0.80 0.68  CALCIUM 9.1 8.9 9.0   LFT Recent Labs    07/24/20 0301  PROT 5.3*  ALBUMIN 1.5*  AST 29  ALT 38  ALKPHOS 117  BILITOT 4.6*   PT/INR No results for input(s): LABPROT, INR in the last 72 hours. Hepatitis Panel No results for input(s): HEPBSAG, HCVAB, HEPAIGM, HEPBIGM in the last 72 hours. C-Diff No results for input(s): CDIFFTOX in the last 72 hours. Fecal Lactopherrin No results for input(s): FECLLACTOFRN in the last 72 hours.  Studies/Results: IR Perc Cholecystostomy  Result Date: 07/22/2020 INDICATION: 55 year old male with acute cholecystitis EXAM: CHOLECYSTOSTOMY; IR ULTRASOUND GUIDANCE MEDICATIONS: None ANESTHESIA/SEDATION: Moderate (conscious) sedation was employed during this  procedure. A total of Versed 1.0 mg and Fentanyl 50 mcg was administered intravenously. Moderate Sedation Time: 10 minutes. The patient's level of consciousness and vital signs were monitored continuously by radiology nursing throughout the procedure under my direct supervision. FLUOROSCOPY TIME:  Fluoroscopy Time: 0 minutes 24 seconds (3 mGy). COMPLICATIONS: None PROCEDURE: Informed written consent was obtained from the patient and the patient's family after a thorough discussion of the procedural risks, benefits and alternatives. All questions were addressed. Maximal Sterile Barrier Technique was utilized including caps, mask, sterile gowns, sterile gloves, sterile drape, hand hygiene and skin antiseptic. A timeout was performed prior to the initiation of the procedure. Ultrasound survey of the right upper quadrant was performed for planning purposes. Once the patient is prepped and draped in the usual sterile fashion, the skin and subcutaneous tissues overlying the gallbladder were generously infiltrated 1% lidocaine for local anesthesia. A coaxial needle was advanced under ultrasound guidance through the skin subcutaneous tissues and a small segment of liver into the gallbladder lumen. With removal of the stylet, spontaneous dark bile drainage occurred. Using modified Seldinger technique, a 10 French drain was placed into the gallbladder fossa, with aspiration of the sample for the lab. Contrast injection confirmed position of the tube within the gallbladder lumen. Drainage catheter was attached to gravity drain with a suture retention placed. Patient tolerated the procedure well and remained hemodynamically stable throughout. No complications were encountered and no significant blood loss encountered. IMPRESSION: Status post percutaneous cholecystostomy Signed, 40. Yvone Neu, RPVI [facility] Radiology Electronically  Signed   By: Gilmer Mor D.O.   On:  07/22/2020 17:21   IR US Guidance  Result Date: 07/22/2020 INDICATION: 55 year old male with acute cholecystitis EXAM: CHOLECYSTOSTOMY; IR ULTRASOUND GUIDANCE MEDICATIONS: None ANESTHESIA/SEDATION: Moderate (conscious) sedation was employed during this procedure. A total of Versed 1.0 mg and Fentanyl 50 mcg was administered intravenously. Moderate Sedation Time: 10 minutes. The patient's level of consciousness and vital signs were monitored continuously by radiology nursing throughout the procedure under my direct supervision. FLUOROSCOPY TIME:  Fluoroscopy Time: 0 minutes 24 seconds (3 mGy). COMPLICATIONS: None PROCEDURE: Informed written consent was obtained from the patient and the patient's family after a thorough discussion of the procedural risks, benefits and alternatives. All questions were addressed. Maximal Sterile Barrier Technique was utilized including caps, mask, sterile gowns, sterile gloves, sterile drape, hand hygiene and skin antiseptic. A timeout was performed prior to the initiation of the procedure. Ultrasound survey of the right upper quadrant was performed for planning purposes. Once the patient is prepped and draped in the usual sterile fashion, the skin and subcutaneous tissues overlying the gallbladder were generously infiltrated 1% lidocaine for local anesthesia. A coaxial needle was advanced under ultrasound guidance through the skin subcutaneous tissues and a small segment of liver into the gallbladder lumen. With removal of the stylet, spontaneous dark bile drainage occurred. Using modified Seldinger technique, a 10 French drain was placed into the gallbladder fossa, with aspiration of the sample for the lab. Contrast injection confirmed position of the tube within the gallbladder lumen. Drainage catheter was attached to gravity drain with a suture retention placed. Patient tolerated the procedure well and remained hemodynamically stable throughout. No complications were encountered  and no significant blood loss encountered. IMPRESSION: Status post percutaneous cholecystostomy Signed, Yvone Neu. Reyne Dumas, RPVI [facility] Radiology Electronically Signed   By: Gilmer Mor D.O.   On: 07/22/2020 17:21    Medications:  Scheduled: . allopurinol  100 mg Oral Daily  . docusate sodium  100 mg Oral Daily  . morphine   Intravenous Q4H  . polyethylene glycol  17 g Oral Daily  . predniSONE  10 mg Oral Q breakfast  . sodium chloride flush  5 mL Intracatheter Q8H   Continuous: . dextrose 5% lactated ringers Stopped (07/24/20 0238)  . piperacillin-tazobactam 3.375 g (07/24/20 0559)  . potassium chloride 10 mEq (07/24/20 0702)    Assessment/Plan: 1) Acute cholecystitis. 2) Acute pancreatitis - resolved. 3) Worsening abdominal pain.   Since the placement his his percutaneous drain his TB declined significantly.  There is also a downward trend of his WBC.  Earlier this AM he started to complain more about upper abdominal pain and back pain.  Nursing thought that there was swelling in the right side of his back, but no abnormalities were palpated or visualized in this area.  He does have a known protrusion on the left side of his back and this was visualized.  Blood was noted in the drainage bag and his WBC did drift down.  With the new pain it may be possible that he is bleeding internally at the catheter site.  From the GI standpoint it appears that his pancreatitis has resolved.  It is believed that all of his current symptoms are from a presumed acute cholecystitis.  He states that the PCA is not helping with his pain.  Plan: 1) Continue with IV hydration. 2) Continue with PCA.  Dr. Allena Katz is going to try and adjust some  of his pain meds. 3) ? Lap chole.  Management per surgery.  LOS: 5 days   Abb Gobert D 07/24/2020, 7:46 AM
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
## 2020-07-25 DIAGNOSIS — K851 Biliary acute pancreatitis without necrosis or infection: Secondary | ICD-10-CM | POA: Diagnosis not present

## 2020-07-25 LAB — CBC WITH DIFFERENTIAL/PLATELET
Abs Immature Granulocytes: 2.9 10*3/uL — ABNORMAL HIGH (ref 0.00–0.07)
Basophils Absolute: 0.1 10*3/uL (ref 0.0–0.1)
Basophils Relative: 1 %
Eosinophils Absolute: 0 10*3/uL (ref 0.0–0.5)
Eosinophils Relative: 0 %
HCT: 22.7 % — ABNORMAL LOW (ref 39.0–52.0)
Hemoglobin: 7.5 g/dL — ABNORMAL LOW (ref 13.0–17.0)
Immature Granulocytes: 12 %
Lymphocytes Relative: 11 %
Lymphs Abs: 2.8 10*3/uL (ref 0.7–4.0)
MCH: 29 pg (ref 26.0–34.0)
MCHC: 33 g/dL (ref 30.0–36.0)
MCV: 87.6 fL (ref 80.0–100.0)
Monocytes Absolute: 1.8 10*3/uL — ABNORMAL HIGH (ref 0.1–1.0)
Monocytes Relative: 7 %
Neutro Abs: 17.2 10*3/uL — ABNORMAL HIGH (ref 1.7–7.7)
Neutrophils Relative %: 69 %
Platelets: 417 10*3/uL — ABNORMAL HIGH (ref 150–400)
RBC: 2.59 MIL/uL — ABNORMAL LOW (ref 4.22–5.81)
RDW: 17.6 % — ABNORMAL HIGH (ref 11.5–15.5)
WBC Morphology: INCREASED
WBC: 24.9 10*3/uL — ABNORMAL HIGH (ref 4.0–10.5)
nRBC: 0 % (ref 0.0–0.2)

## 2020-07-25 LAB — COMPREHENSIVE METABOLIC PANEL
ALT: 40 U/L (ref 0–44)
AST: 50 U/L — ABNORMAL HIGH (ref 15–41)
Albumin: 1.5 g/dL — ABNORMAL LOW (ref 3.5–5.0)
Alkaline Phosphatase: 170 U/L — ABNORMAL HIGH (ref 38–126)
Anion gap: 10 (ref 5–15)
BUN: 7 mg/dL (ref 6–20)
CO2: 24 mmol/L (ref 22–32)
Calcium: 8.9 mg/dL (ref 8.9–10.3)
Chloride: 100 mmol/L (ref 98–111)
Creatinine, Ser: 0.7 mg/dL (ref 0.61–1.24)
GFR, Estimated: >60 mL/min (ref >60)
Glucose, Bld: 176 mg/dL — ABNORMAL HIGH (ref 70–99)
Potassium: 3.3 mmol/L — ABNORMAL LOW (ref 3.5–5.1)
Sodium: 134 mmol/L — ABNORMAL LOW (ref 135–145)
Total Bilirubin: 7 mg/dL — ABNORMAL HIGH (ref 0.3–1.2)
Total Protein: 5.3 g/dL — ABNORMAL LOW (ref 6.5–8.1)

## 2020-07-25 LAB — GLUCOSE, CAPILLARY
Glucose-Capillary: 163 mg/dL — ABNORMAL HIGH (ref 70–99)
Glucose-Capillary: 163 mg/dL — ABNORMAL HIGH (ref 70–99)
Glucose-Capillary: 164 mg/dL — ABNORMAL HIGH (ref 70–99)
Glucose-Capillary: 176 mg/dL — ABNORMAL HIGH (ref 70–99)
Glucose-Capillary: 185 mg/dL — ABNORMAL HIGH (ref 70–99)
Glucose-Capillary: 194 mg/dL — ABNORMAL HIGH (ref 70–99)

## 2020-07-25 LAB — MAGNESIUM: Magnesium: 1.7 mg/dL (ref 1.7–2.4)

## 2020-07-25 MED ORDER — ALBUMIN HUMAN 25 % IV SOLN
12.5000 g | Freq: Once | INTRAVENOUS | Status: AC
  Administered 2020-07-25: 12.5 g via INTRAVENOUS
  Filled 2020-07-25: qty 50

## 2020-07-25 MED ORDER — POTASSIUM CHLORIDE 10 MEQ/100ML IV SOLN
10.0000 meq | INTRAVENOUS | Status: AC
Start: 2020-07-25 — End: 2020-07-25
  Administered 2020-07-25 (×5): 10 meq via INTRAVENOUS
  Filled 2020-07-25 (×6): qty 100

## 2020-07-25 MED ORDER — MAGNESIUM SULFATE 2 GM/50ML IV SOLN
2.0000 g | Freq: Once | INTRAVENOUS | Status: AC
  Administered 2020-07-25: 2 g via INTRAVENOUS
  Filled 2020-07-25: qty 50
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
## 2020-07-26 DIAGNOSIS — K851 Biliary acute pancreatitis without necrosis or infection: Secondary | ICD-10-CM | POA: Diagnosis not present

## 2020-07-26 LAB — CBC WITH DIFFERENTIAL/PLATELET
Abs Immature Granulocytes: 4.45 10*3/uL — ABNORMAL HIGH (ref 0.00–0.07)
Basophils Absolute: 0.1 10*3/uL (ref 0.0–0.1)
Basophils Relative: 0 %
Eosinophils Absolute: 0.1 10*3/uL (ref 0.0–0.5)
Eosinophils Relative: 1 %
HCT: 21.9 % — ABNORMAL LOW (ref 39.0–52.0)
Hemoglobin: 7.1 g/dL — ABNORMAL LOW (ref 13.0–17.0)
Immature Granulocytes: 16 %
Lymphocytes Relative: 12 %
Lymphs Abs: 3.3 10*3/uL (ref 0.7–4.0)
MCH: 28.7 pg (ref 26.0–34.0)
MCHC: 32.4 g/dL (ref 30.0–36.0)
MCV: 88.7 fL (ref 80.0–100.0)
Monocytes Absolute: 1.7 10*3/uL — ABNORMAL HIGH (ref 0.1–1.0)
Monocytes Relative: 6 %
Neutro Abs: 17.6 10*3/uL — ABNORMAL HIGH (ref 1.7–7.7)
Neutrophils Relative %: 65 %
Platelets: 488 10*3/uL — ABNORMAL HIGH (ref 150–400)
RBC: 2.47 MIL/uL — ABNORMAL LOW (ref 4.22–5.81)
RDW: 17.9 % — ABNORMAL HIGH (ref 11.5–15.5)
WBC: 27.3 10*3/uL — ABNORMAL HIGH (ref 4.0–10.5)
nRBC: 0.4 % — ABNORMAL HIGH (ref 0.0–0.2)

## 2020-07-26 LAB — COMPREHENSIVE METABOLIC PANEL
ALT: 40 U/L (ref 0–44)
AST: 47 U/L — ABNORMAL HIGH (ref 15–41)
Albumin: 1.8 g/dL — ABNORMAL LOW (ref 3.5–5.0)
Alkaline Phosphatase: 181 U/L — ABNORMAL HIGH (ref 38–126)
Anion gap: 10 (ref 5–15)
BUN: 7 mg/dL (ref 6–20)
CO2: 24 mmol/L (ref 22–32)
Calcium: 9.1 mg/dL (ref 8.9–10.3)
Chloride: 99 mmol/L (ref 98–111)
Creatinine, Ser: 0.58 mg/dL — ABNORMAL LOW (ref 0.61–1.24)
GFR, Estimated: >60 mL/min (ref >60)
Glucose, Bld: 206 mg/dL — ABNORMAL HIGH (ref 70–99)
Potassium: 3.9 mmol/L (ref 3.5–5.1)
Sodium: 133 mmol/L — ABNORMAL LOW (ref 135–145)
Total Bilirubin: 6.3 mg/dL — ABNORMAL HIGH (ref 0.3–1.2)
Total Protein: 5.7 g/dL — ABNORMAL LOW (ref 6.5–8.1)

## 2020-07-26 LAB — GLUCOSE, CAPILLARY
Glucose-Capillary: 171 mg/dL — ABNORMAL HIGH (ref 70–99)
Glucose-Capillary: 174 mg/dL — ABNORMAL HIGH (ref 70–99)
Glucose-Capillary: 200 mg/dL — ABNORMAL HIGH (ref 70–99)
Glucose-Capillary: 227 mg/dL — ABNORMAL HIGH (ref 70–99)
Glucose-Capillary: 253 mg/dL — ABNORMAL HIGH (ref 70–99)

## 2020-07-26 LAB — CBC
HCT: 23.3 % — ABNORMAL LOW (ref 39.0–52.0)
Hemoglobin: 7.3 g/dL — ABNORMAL LOW (ref 13.0–17.0)
MCH: 28.3 pg (ref 26.0–34.0)
MCHC: 31.3 g/dL (ref 30.0–36.0)
MCV: 90.3 fL (ref 80.0–100.0)
Platelets: 587 10*3/uL — ABNORMAL HIGH (ref 150–400)
RBC: 2.58 MIL/uL — ABNORMAL LOW (ref 4.22–5.81)
RDW: 17.8 % — ABNORMAL HIGH (ref 11.5–15.5)
WBC: 28.6 10*3/uL — ABNORMAL HIGH (ref 4.0–10.5)
nRBC: 0.9 % — ABNORMAL HIGH (ref 0.0–0.2)

## 2020-07-26 LAB — MAGNESIUM: Magnesium: 1.9 mg/dL (ref 1.7–2.4)

## 2020-07-26 MED ORDER — DOCUSATE SODIUM 100 MG PO CAPS
100.0000 mg | ORAL_CAPSULE | Freq: Two times a day (BID) | ORAL | Status: DC
Start: 2020-07-26 — End: 2020-08-01
  Administered 2020-07-26 – 2020-08-01 (×11): 100 mg via ORAL
  Filled 2020-07-26 (×11): qty 1

## 2020-07-26 MED ORDER — SODIUM CHLORIDE 0.9 % IV SOLN
3.0000 g | Freq: Four times a day (QID) | INTRAVENOUS | Status: DC
  Administered 2020-07-26 – 2020-07-28 (×8): 3 g via INTRAVENOUS
  Filled 2020-07-26 (×12): qty 8

## 2020-07-26 MED ORDER — MORPHINE SULFATE 1 MG/ML IV SOLN PCA
INTRAVENOUS | Status: DC
  Administered 2020-07-26: 7.5 mg via INTRAVENOUS
  Administered 2020-07-26: 3 mg via INTRAVENOUS
  Administered 2020-07-26: 0 mg via INTRAVENOUS
  Administered 2020-07-27: 3 mg via INTRAVENOUS
  Administered 2020-07-27: 13.13 mg via INTRAVENOUS
  Filled 2020-07-26: qty 30

## 2020-07-26 MED ORDER — ENSURE ENLIVE PO LIQD
237.0000 mL | Freq: Two times a day (BID) | ORAL | Status: DC
  Administered 2020-07-26 – 2020-08-01 (×8): 237 mL via ORAL

## 2020-07-26 MED ORDER — OXYCODONE HCL ER 15 MG PO T12A
15.0000 mg | EXTENDED_RELEASE_TABLET | Freq: Two times a day (BID) | ORAL | Status: DC
Start: 2020-07-26 — End: 2020-08-01
  Administered 2020-07-26 – 2020-08-01 (×13): 15 mg via ORAL
  Filled 2020-07-26 (×13): qty 1
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
## 2020-07-26 NOTE — Progress Notes (Signed)
Patient non compliant with managing blood sugar and continues to drink sodas with sugar present, RN educated patient and will continue to monitor this patient.
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
## 2020-07-26 NOTE — Progress Notes (Signed)
Pharmacy Antibiotic Note  Billy Cowan is a 55 y.o. male admitted on 07/19/2020 with presumed cholecystitis and pancreatitis.  Pharmacy has been consulted for Unasyn dosing. Patient previously on Day 8 of zosyn, but patient does not need Pseudomonas coverage per MD, therefore deescalating to ampicillin-sulbactam. Gall bladder cultures NGTD.   Plan: Unasyn 3gm Iv q6h F/u discussion for abx length of therapy for presumed cholecystitis with drain in place.   Height: 5\' 10"  (177.8 cm) Weight: 82.6 kg (182 lb) IBW/kg (Calculated) : 73  Temp (24hrs), Avg:98.6 F (37 C), Min:98.2 F (36.8 C), Max:99.1 F (37.3 C)  Recent Labs  Lab 07/19/20 2003 07/20/20 0303 07/23/20 0554 07/24/20 0301 07/24/20 1308 07/25/20 0237 07/26/20 0325  WBC  --    < > 24.6* 23.8* 24.8* 24.9* 27.3*  CREATININE  --    < > 0.80 0.68 0.65 0.70 0.58*  LATICACIDVEN 2.1*  --   --   --   --   --   --    < > = values in this interval not displayed.    Estimated Creatinine Clearance: 110.3 mL/min (A) (by C-G formula based on SCr of 0.58 mg/dL (L)).    No Known Allergies  Antimicrobials this admission: 3/21 zosyn >> 3/28 3/28 Unasyn >>    Microbiology results: 3/21 BCx: NGF 3/21 UCx: NGF 3/24 Gall bladder NGTD    [name] A. 4/24, PharmD, BCPS, FNKF Clinical Pharmacist [place] Please utilize Amion for appropriate phone number to reach the unit pharmacist [facility])   07/26/2020 10:28 AM

## 2020-07-26 NOTE — Progress Notes (Signed)
Pt only wanted IV on Rt arm - Placed tourniquet & did not see any viable veins. Talked to patient about ultrasound to be able to see veins that I could not see visually. All Set up done - using # 20 gauge mid to upper FA on Rt side. Good flashback of blood - advancing catheter in vessel and pt told me I was taking too long pulled his arm away & told me to stop - asked pt if he was experiencing pain or tenderness and he said " no" told me to pack it up and he will think about an IV tomorrow.

## 2020-07-26 NOTE — Progress Notes (Addendum)
Inpatient Diabetes Program Recommendations  AACE/ADA: New Consensus Statement on Inpatient Glycemic Control (2015)  Target Ranges:  Prepandial:   less than 140 mg/dL      Peak postprandial:   less than 180 mg/dL (1-2 hours)      Critically ill patients:  140 - 180 mg/dL   Lab Results  Component Value Date   GLUCAP 174 (H) 07/26/2020   HGBA1C 9.0 (H) 07/19/2020    Review of Glycemic Control Results for RAZA, BAYLESS (MRN [id number]) as of 07/26/2020 10:20  Ref. Range 07/25/2020 04:36 07/25/2020 11:43 07/25/2020 16:03 07/25/2020 20:06 07/25/2020 23:46 07/26/2020 04:30 07/26/2020 07:50  Glucose-Capillary Latest Ref Range: 70 - 99 mg/dL 332 (H) 951 (H) 884 (H) 194 (H) 176 (H) 171 (H) 174 (H)   Gallstone pancreatitis Diabetes history: DM 2 Outpatient Diabetes medications: Synjardy 12.5-10002 tablets daily Current orders for Inpatient glycemic control: None  Inpatient Diabetes Program Recommendations:    -  Start Novolog 0-9 units tid + hs  Addendum 1128 am: Spoke with pt at bedside regarding A1c 9% and glucose control at home. Pt sees Dr. Susa Raring with [name], and had seen Dr. Sharl Ma, Endocrinologist, 2 years ago. Pt felt he was doing good on Synjardy and has only ben going to his PCP for refills. Pt reports A1c trends in the 7% range. Pt reports lately he has been on several rounds of prednisone and 5 doses of cortisol for systemic gout, he also had COVID 2 years ago (started insulin for 1 week at that time) he felt like it was very expensive for just one week and has not been on it since. After his flare ups his trends come back down. Pt reports having all needed supplies at home. Informed pt of A1c and encouraged follow up and glucose control.  Thanks,  Christena Deem RN, MSN, BC-ADM Inpatient Diabetes Coordinator Team Pager [phone] (8a-5p)

## 2020-07-26 NOTE — Plan of Care (Signed)

## 2020-07-27 DIAGNOSIS — K851 Biliary acute pancreatitis without necrosis or infection: Secondary | ICD-10-CM | POA: Diagnosis not present

## 2020-07-27 LAB — COMPREHENSIVE METABOLIC PANEL
ALT: 31 U/L (ref 0–44)
AST: 30 U/L (ref 15–41)
Albumin: 1.5 g/dL — ABNORMAL LOW (ref 3.5–5.0)
Alkaline Phosphatase: 179 U/L — ABNORMAL HIGH (ref 38–126)
Anion gap: 7 (ref 5–15)
BUN: 5 mg/dL — ABNORMAL LOW (ref 6–20)
CO2: 26 mmol/L (ref 22–32)
Calcium: 8.6 mg/dL — ABNORMAL LOW (ref 8.9–10.3)
Chloride: 100 mmol/L (ref 98–111)
Creatinine, Ser: 0.58 mg/dL — ABNORMAL LOW (ref 0.61–1.24)
GFR, Estimated: >60 mL/min (ref >60)
Glucose, Bld: 236 mg/dL — ABNORMAL HIGH (ref 70–99)
Potassium: 3.2 mmol/L — ABNORMAL LOW (ref 3.5–5.1)
Sodium: 133 mmol/L — ABNORMAL LOW (ref 135–145)
Total Bilirubin: 3.5 mg/dL — ABNORMAL HIGH (ref 0.3–1.2)
Total Protein: 5.4 g/dL — ABNORMAL LOW (ref 6.5–8.1)

## 2020-07-27 LAB — CBC WITH DIFFERENTIAL/PLATELET
Abs Immature Granulocytes: 5.16 10*3/uL — ABNORMAL HIGH (ref 0.00–0.07)
Basophils Absolute: 0.1 10*3/uL (ref 0.0–0.1)
Basophils Relative: 0 %
Eosinophils Absolute: 0.2 10*3/uL (ref 0.0–0.5)
Eosinophils Relative: 1 %
HCT: 18.7 % — ABNORMAL LOW (ref 39.0–52.0)
Hemoglobin: 5.9 g/dL — CL (ref 13.0–17.0)
Immature Granulocytes: 16 %
Lymphocytes Relative: 15 %
Lymphs Abs: 4.9 10*3/uL — ABNORMAL HIGH (ref 0.7–4.0)
MCH: 28.5 pg (ref 26.0–34.0)
MCHC: 31.6 g/dL (ref 30.0–36.0)
MCV: 90.3 fL (ref 80.0–100.0)
Monocytes Absolute: 2.4 10*3/uL — ABNORMAL HIGH (ref 0.1–1.0)
Monocytes Relative: 8 %
Neutro Abs: 19.3 10*3/uL — ABNORMAL HIGH (ref 1.7–7.7)
Neutrophils Relative %: 60 %
Platelets: 523 10*3/uL — ABNORMAL HIGH (ref 150–400)
RBC: 2.07 MIL/uL — ABNORMAL LOW (ref 4.22–5.81)
RDW: 17.4 % — ABNORMAL HIGH (ref 11.5–15.5)
WBC: 32.1 10*3/uL — ABNORMAL HIGH (ref 4.0–10.5)
nRBC: 0.9 % — ABNORMAL HIGH (ref 0.0–0.2)

## 2020-07-27 LAB — GLUCOSE, CAPILLARY
Glucose-Capillary: 194 mg/dL — ABNORMAL HIGH (ref 70–99)
Glucose-Capillary: 207 mg/dL — ABNORMAL HIGH (ref 70–99)
Glucose-Capillary: 186 mg/dL — ABNORMAL HIGH (ref 70–99)
Glucose-Capillary: 195 mg/dL — ABNORMAL HIGH (ref 70–99)

## 2020-07-27 LAB — URIC ACID: Uric Acid, Serum: 2.2 mg/dL — ABNORMAL LOW (ref 3.7–8.6)

## 2020-07-27 LAB — AEROBIC/ANAEROBIC CULTURE W GRAM STAIN (SURGICAL/DEEP WOUND): Culture: NO GROWTH

## 2020-07-27 LAB — MAGNESIUM: Magnesium: 1.7 mg/dL (ref 1.7–2.4)

## 2020-07-27 LAB — HEMOGLOBIN AND HEMATOCRIT, BLOOD
HCT: 23.7 % — ABNORMAL LOW (ref 39.0–52.0)
Hemoglobin: 7.7 g/dL — ABNORMAL LOW (ref 13.0–17.0)

## 2020-07-27 LAB — PROCALCITONIN: Procalcitonin: 1.04 ng/mL

## 2020-07-27 LAB — HEMOGLOBIN A1C
Hgb A1c MFr Bld: 7.4 % — ABNORMAL HIGH (ref 4.8–5.6)
Mean Plasma Glucose: 165.68 mg/dL

## 2020-07-27 LAB — PREPARE RBC (CROSSMATCH)

## 2020-07-27 MED ORDER — INSULIN ASPART 100 UNIT/ML ~~LOC~~ SOLN
0.0000 [IU] | Freq: Three times a day (TID) | SUBCUTANEOUS | Status: DC
  Administered 2020-07-27: 3 [IU] via SUBCUTANEOUS
  Administered 2020-07-27 – 2020-07-29 (×5): 2 [IU] via SUBCUTANEOUS
  Administered 2020-07-29 – 2020-07-30 (×3): 3 [IU] via SUBCUTANEOUS
  Administered 2020-07-31 – 2020-08-01 (×4): 2 [IU] via SUBCUTANEOUS
  Administered 2020-08-01: 3 [IU] via SUBCUTANEOUS

## 2020-07-27 MED ORDER — POTASSIUM CHLORIDE CRYS ER 20 MEQ PO TBCR
40.0000 meq | EXTENDED_RELEASE_TABLET | Freq: Once | ORAL | Status: AC
  Administered 2020-07-27: 40 meq via ORAL
  Filled 2020-07-27: qty 2

## 2020-07-27 MED ORDER — HYDRALAZINE HCL 25 MG PO TABS: 25.0000 mg | ORAL_TABLET | ORAL | Status: DC | PRN

## 2020-07-27 MED ORDER — INSULIN ASPART 100 UNIT/ML ~~LOC~~ SOLN
0.0000 [IU] | Freq: Every day | SUBCUTANEOUS | Status: DC
  Administered 2020-07-28 – 2020-07-30 (×3): 2 [IU] via SUBCUTANEOUS
  Administered 2020-07-31: 5 [IU] via SUBCUTANEOUS

## 2020-07-27 MED ORDER — METOPROLOL TARTRATE 5 MG/5ML IV SOLN: 5.0000 mg | INTRAVENOUS | Status: DC | PRN

## 2020-07-27 MED ORDER — MORPHINE SULFATE (PF) 2 MG/ML IV SOLN
2.0000 mg | INTRAVENOUS | Status: DC | PRN
  Administered 2020-07-27 – 2020-08-01 (×24): 2 mg via INTRAVENOUS
  Filled 2020-07-27 (×12): qty 1
  Filled 2020-07-27: qty 2
  Filled 2020-07-27 (×15): qty 1

## 2020-07-27 MED ORDER — MAGNESIUM OXIDE 400 (241.3 MG) MG PO TABS
800.0000 mg | ORAL_TABLET | Freq: Once | ORAL | Status: AC
  Administered 2020-07-27: 800 mg via ORAL
  Filled 2020-07-27: qty 2

## 2020-07-27 MED ORDER — SODIUM CHLORIDE 0.9 % IV SOLN: INTRAVENOUS | Status: DC

## 2020-07-27 MED ORDER — SODIUM CHLORIDE 0.9% IV SOLUTION: Freq: Once | INTRAVENOUS | Status: AC
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
## 2020-07-27 NOTE — Progress Notes (Signed)
CRITICAL VALUE ALERT:   Critical Value: Hemoglobin 5.9 g/dL @ 1749   Name of Provider Notified: MD Opyd    Orders Received? Or Actions Taken?: transfuse 1 U RBC
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
## 2020-07-28 ENCOUNTER — Inpatient Hospital Stay (HOSPITAL_COMMUNITY): Payer: 59

## 2020-07-28 DIAGNOSIS — M7989 Other specified soft tissue disorders: Secondary | ICD-10-CM | POA: Diagnosis not present

## 2020-07-28 DIAGNOSIS — K922 Gastrointestinal hemorrhage, unspecified: Secondary | ICD-10-CM | POA: Diagnosis not present

## 2020-07-28 DIAGNOSIS — K851 Biliary acute pancreatitis without necrosis or infection: Secondary | ICD-10-CM | POA: Diagnosis not present

## 2020-07-28 LAB — CBC WITH DIFFERENTIAL/PLATELET
Abs Immature Granulocytes: 5.81 10*3/uL — ABNORMAL HIGH (ref 0.00–0.07)
Basophils Absolute: 0.1 10*3/uL (ref 0.0–0.1)
Basophils Relative: 0 %
Eosinophils Absolute: 0.2 10*3/uL (ref 0.0–0.5)
Eosinophils Relative: 1 %
HCT: 20.4 % — ABNORMAL LOW (ref 39.0–52.0)
Hemoglobin: 6.7 g/dL — CL (ref 13.0–17.0)
Immature Granulocytes: 16 %
Lymphocytes Relative: 14 %
Lymphs Abs: 5.1 10*3/uL — ABNORMAL HIGH (ref 0.7–4.0)
MCH: 29.6 pg (ref 26.0–34.0)
MCHC: 32.8 g/dL (ref 30.0–36.0)
MCV: 90.3 fL (ref 80.0–100.0)
Monocytes Absolute: 2.3 10*3/uL — ABNORMAL HIGH (ref 0.1–1.0)
Monocytes Relative: 6 %
Neutro Abs: 24.1 10*3/uL — ABNORMAL HIGH (ref 1.7–7.7)
Neutrophils Relative %: 63 %
Platelets: 557 10*3/uL — ABNORMAL HIGH (ref 150–400)
RBC: 2.26 MIL/uL — ABNORMAL LOW (ref 4.22–5.81)
RDW: 16.9 % — ABNORMAL HIGH (ref 11.5–15.5)
WBC: 37.6 10*3/uL — ABNORMAL HIGH (ref 4.0–10.5)
nRBC: 1.1 % — ABNORMAL HIGH (ref 0.0–0.2)

## 2020-07-28 LAB — BPAM RBC
Blood Product Expiration Date: 202204262359
ISSUE DATE / TIME: 202203290522
Unit Type and Rh: 5100

## 2020-07-28 LAB — COMPREHENSIVE METABOLIC PANEL
ALT: 26 U/L (ref 0–44)
AST: 22 U/L (ref 15–41)
Albumin: 1.5 g/dL — ABNORMAL LOW (ref 3.5–5.0)
Alkaline Phosphatase: 183 U/L — ABNORMAL HIGH (ref 38–126)
Anion gap: 7 (ref 5–15)
BUN: 6 mg/dL (ref 6–20)
CO2: 24 mmol/L (ref 22–32)
Calcium: 8.6 mg/dL — ABNORMAL LOW (ref 8.9–10.3)
Chloride: 101 mmol/L (ref 98–111)
Creatinine, Ser: 0.61 mg/dL (ref 0.61–1.24)
GFR, Estimated: >60 mL/min (ref >60)
Glucose, Bld: 199 mg/dL — ABNORMAL HIGH (ref 70–99)
Potassium: 3.6 mmol/L (ref 3.5–5.1)
Sodium: 132 mmol/L — ABNORMAL LOW (ref 135–145)
Total Bilirubin: 3 mg/dL — ABNORMAL HIGH (ref 0.3–1.2)
Total Protein: 5.5 g/dL — ABNORMAL LOW (ref 6.5–8.1)

## 2020-07-28 LAB — MAGNESIUM: Magnesium: 1.6 mg/dL — ABNORMAL LOW (ref 1.7–2.4)

## 2020-07-28 LAB — GLUCOSE, CAPILLARY
Glucose-Capillary: 183 mg/dL — ABNORMAL HIGH (ref 70–99)
Glucose-Capillary: 188 mg/dL — ABNORMAL HIGH (ref 70–99)
Glucose-Capillary: 180 mg/dL — ABNORMAL HIGH (ref 70–99)
Glucose-Capillary: 216 mg/dL — ABNORMAL HIGH (ref 70–99)

## 2020-07-28 LAB — TYPE AND SCREEN
ABO/RH(D): O POS
Antibody Screen: NEGATIVE
Unit division: 0

## 2020-07-28 LAB — HEMOGLOBIN AND HEMATOCRIT, BLOOD
HCT: 24.4 % — ABNORMAL LOW (ref 39.0–52.0)
Hemoglobin: 8 g/dL — ABNORMAL LOW (ref 13.0–17.0)

## 2020-07-28 LAB — PREPARE RBC (CROSSMATCH)

## 2020-07-28 MED ORDER — PREDNISONE 10 MG PO TABS
10.0000 mg | ORAL_TABLET | Freq: Every day | ORAL | Status: DC
  Administered 2020-07-29 – 2020-08-01 (×4): 10 mg via ORAL
  Filled 2020-07-28 (×4): qty 1

## 2020-07-28 MED ORDER — PANTOPRAZOLE SODIUM 40 MG IV SOLR
40.0000 mg | Freq: Two times a day (BID) | INTRAVENOUS | Status: DC
  Administered 2020-07-28 – 2020-08-01 (×9): 40 mg via INTRAVENOUS
  Filled 2020-07-28 (×9): qty 40

## 2020-07-28 MED ORDER — SODIUM CHLORIDE 0.9% IV SOLUTION: Freq: Once | INTRAVENOUS | Status: DC
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
## 2020-07-28 NOTE — Plan of Care (Signed)

## 2020-07-28 NOTE — Progress Notes (Signed)
Pt and his wife would like to be notified prior to any procedures along with updates.   Pia Jedlicka, RN

## 2020-07-28 NOTE — Progress Notes (Signed)
Lower extremity venous bilateral study completed.   Please see CV Proc for preliminary results.   Shereta Crothers, RDMS, RVT  

## 2020-07-28 NOTE — Progress Notes (Signed)
Date and time results received: 07/28/20 810 (use smartphrase ".now" to insert current time)  Test: hemoglobin  Critical Value: 6.7  Name of Provider Notified: Lewie Chamber  Orders Received? Or Actions Taken?: new orders placed  Brooklin Rieger, RN
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
## 2020-07-29 ENCOUNTER — Inpatient Hospital Stay (HOSPITAL_COMMUNITY): Payer: 59 | Admitting: Anesthesiology

## 2020-07-29 ENCOUNTER — Encounter (HOSPITAL_COMMUNITY)
Admission: EM | Disposition: A | Discharge status: Home / Self Care | Payer: Self-pay | Source: Home / Self Care | Attending: Internal Medicine

## 2020-07-29 ENCOUNTER — Encounter (HOSPITAL_COMMUNITY): Payer: Self-pay | Admitting: Internal Medicine

## 2020-07-29 DIAGNOSIS — K851 Biliary acute pancreatitis without necrosis or infection: Secondary | ICD-10-CM | POA: Diagnosis not present

## 2020-07-29 DIAGNOSIS — K922 Gastrointestinal hemorrhage, unspecified: Secondary | ICD-10-CM

## 2020-07-29 HISTORY — PX: ESOPHAGOGASTRODUODENOSCOPY (EGD) WITH PROPOFOL: SHX5813

## 2020-07-29 LAB — CBC WITH DIFFERENTIAL/PLATELET
Abs Immature Granulocytes: 3.75 10*3/uL — ABNORMAL HIGH (ref 0.00–0.07)
Basophils Absolute: 0.1 10*3/uL (ref 0.0–0.1)
Basophils Relative: 0 %
Eosinophils Absolute: 0.2 10*3/uL (ref 0.0–0.5)
Eosinophils Relative: 1 %
HCT: 25.6 % — ABNORMAL LOW (ref 39.0–52.0)
Hemoglobin: 8.3 g/dL — ABNORMAL LOW (ref 13.0–17.0)
Immature Granulocytes: 12 %
Lymphocytes Relative: 14 %
Lymphs Abs: 4.6 10*3/uL — ABNORMAL HIGH (ref 0.7–4.0)
MCH: 29.1 pg (ref 26.0–34.0)
MCHC: 32.4 g/dL (ref 30.0–36.0)
MCV: 89.8 fL (ref 80.0–100.0)
Monocytes Absolute: 2.1 10*3/uL — ABNORMAL HIGH (ref 0.1–1.0)
Monocytes Relative: 7 %
Neutro Abs: 21.1 10*3/uL — ABNORMAL HIGH (ref 1.7–7.7)
Neutrophils Relative %: 66 %
Platelets: 640 10*3/uL — ABNORMAL HIGH (ref 150–400)
RBC: 2.85 MIL/uL — ABNORMAL LOW (ref 4.22–5.81)
RDW: 16.3 % — ABNORMAL HIGH (ref 11.5–15.5)
WBC: 31.8 10*3/uL — ABNORMAL HIGH (ref 4.0–10.5)
nRBC: 0.6 % — ABNORMAL HIGH (ref 0.0–0.2)

## 2020-07-29 LAB — COMPREHENSIVE METABOLIC PANEL
ALT: 23 U/L (ref 0–44)
AST: 20 U/L (ref 15–41)
Albumin: 1.7 g/dL — ABNORMAL LOW (ref 3.5–5.0)
Alkaline Phosphatase: 175 U/L — ABNORMAL HIGH (ref 38–126)
Anion gap: 7 (ref 5–15)
BUN: <5 mg/dL — ABNORMAL LOW (ref 6–20)
CO2: 25 mmol/L (ref 22–32)
Calcium: 8.7 mg/dL — ABNORMAL LOW (ref 8.9–10.3)
Chloride: 100 mmol/L (ref 98–111)
Creatinine, Ser: 0.57 mg/dL — ABNORMAL LOW (ref 0.61–1.24)
GFR, Estimated: >60 mL/min (ref >60)
Glucose, Bld: 220 mg/dL — ABNORMAL HIGH (ref 70–99)
Potassium: 3.4 mmol/L — ABNORMAL LOW (ref 3.5–5.1)
Sodium: 132 mmol/L — ABNORMAL LOW (ref 135–145)
Total Bilirubin: 3 mg/dL — ABNORMAL HIGH (ref 0.3–1.2)
Total Protein: 5.9 g/dL — ABNORMAL LOW (ref 6.5–8.1)

## 2020-07-29 LAB — GLUCOSE, CAPILLARY
Glucose-Capillary: 215 mg/dL — ABNORMAL HIGH (ref 70–99)
Glucose-Capillary: 169 mg/dL — ABNORMAL HIGH (ref 70–99)
Glucose-Capillary: 158 mg/dL — ABNORMAL HIGH (ref 70–99)
Glucose-Capillary: 235 mg/dL — ABNORMAL HIGH (ref 70–99)

## 2020-07-29 LAB — MAGNESIUM: Magnesium: 1.6 mg/dL — ABNORMAL LOW (ref 1.7–2.4)

## 2020-07-29 LAB — OCCULT BLOOD X 1 CARD TO LAB, STOOL: Fecal Occult Bld: POSITIVE — AB

## 2020-07-29 SURGERY — ESOPHAGOGASTRODUODENOSCOPY (EGD) WITH PROPOFOL
Anesthesia: General

## 2020-07-29 MED ORDER — POTASSIUM CHLORIDE 10 MEQ/100ML IV SOLN
10.0000 meq | INTRAVENOUS | Status: AC
  Administered 2020-07-29 (×2): 10 meq via INTRAVENOUS
  Filled 2020-07-29 (×2): qty 100

## 2020-07-29 MED ORDER — MAGNESIUM SULFATE 2 GM/50ML IV SOLN
2.0000 g | Freq: Once | INTRAVENOUS | Status: AC
  Administered 2020-07-29: 2 g via INTRAVENOUS
  Filled 2020-07-29: qty 50

## 2020-07-29 MED ORDER — SODIUM CHLORIDE 0.9 % IV SOLN: INTRAVENOUS | Status: DC

## 2020-07-29 MED ORDER — PROPOFOL 500 MG/50ML IV EMUL
INTRAVENOUS | Status: DC | PRN
  Administered 2020-07-29: 150 ug/kg/min via INTRAVENOUS

## 2020-07-29 MED ORDER — PROPOFOL 10 MG/ML IV BOLUS
INTRAVENOUS | Status: DC | PRN
  Administered 2020-07-29: 50 mg via INTRAVENOUS

## 2020-07-29 MED ORDER — LACTATED RINGERS IV SOLN
INTRAVENOUS | Status: DC
  Administered 2020-07-29: 1000 mL via INTRAVENOUS

## 2020-07-29 SURGICAL SUPPLY — 14 items

## 2020-07-29 NOTE — Anesthesia Procedure Notes (Signed)
Procedure Name: MAC Date/Time: 07/29/2020 1:36 PM Performed by: Leonor Liv, CRNA Pre-anesthesia Checklist: Emergency Drugs available, Patient identified, Suction available, Patient being monitored and Timeout performed Patient Re-evaluated:Patient Re-evaluated prior to induction Oxygen Delivery Method: Nasal cannula Airway Equipment and Method: Bite block Placement Confirmation: positive ETCO2 Dental Injury: Teeth and Oropharynx as per pre-operative assessment

## 2020-07-29 NOTE — Anesthesia Postprocedure Evaluation (Signed)
Anesthesia Post Note  Patient: SIPRIANO FENDLEY  Procedure(s) Performed: ESOPHAGOGASTRODUODENOSCOPY (EGD) WITH PROPOFOL (N/A )     Patient location during evaluation: Endoscopy Anesthesia Type: General Level of consciousness: awake and alert Pain management: pain level controlled Vital Signs Assessment: post-procedure vital signs reviewed and stable Respiratory status: spontaneous breathing, nonlabored ventilation, respiratory function stable and patient connected to nasal cannula oxygen Cardiovascular status: stable and blood pressure returned to baseline Postop Assessment: no apparent nausea or vomiting Anesthetic complications: no   No complications documented.  Last Vitals:  Vitals:   07/29/20 1507 07/29/20 1644  BP: 132/83 136/85  Pulse: 98 (!) 106  Resp: 18 18  Temp: 36.8 C 37.1 C  SpO2: 96% 97%    Last Pain:  Vitals:   07/29/20 1644  TempSrc: Oral  PainSc:                  Tyrina Hines COKER

## 2020-07-29 NOTE — Progress Notes (Signed)
Inpatient Diabetes Program Recommendations  AACE/ADA: New Consensus Statement on Inpatient Glycemic Control (2015)  Target Ranges:  Prepandial:   less than 140 mg/dL      Peak postprandial:   less than 180 mg/dL (1-2 hours)      Critically ill patients:  140 - 180 mg/dL   Lab Results  Component Value Date   GLUCAP 215 (H) 07/29/2020   HGBA1C 7.4 (H) 07/27/2020    Review of Glycemic Control Results for Billy Cowan, Billy Cowan (MRN [id number]) as of 07/29/2020 10:18  Ref. Range 07/28/2020 06:34 07/28/2020 11:48 07/28/2020 16:52 07/28/2020 21:56 07/29/2020 07:55  Glucose-Capillary Latest Ref Range: 70 - 99 mg/dL 062 (H) 694 (H) 854 (H) 216 (H) 215 (H)   Gallstone pancreatitis Diabetes history: DM 2 Outpatient Diabetes medications: Synjardy 12.5-10002 tablets daily Current orders for Inpatient glycemic control: None  PO prednisone 10 mg Daily  Inpatient Diabetes Program Recommendations:    -  Increase insulin to "resistant" Novolog 0-20 units tid + hs  Thanks,  Christena Deem RN, MSN, BC-ADM Inpatient Diabetes Coordinator Team Pager [phone] (8a-5p)
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
## 2020-07-29 NOTE — Op Note (Signed)
[facility] Name: Billy DexterJoseph Corbit Procedure Date : 07/29/2020 MRN: [id number] Attending MD: Jeani HawkingPatrick Gurinder Toral , MD Date of Birth: 01/12/1966 CSN: [id number] Age: 55 Admit Type: Inpatient Procedure:                Upper GI endoscopy Indications:              Melena Providers:                Jeani HawkingPatrick Janey Petron, MD, Fayrene FearingLaura Turner, RN, Fransisca ConnorsMichael                            Williams, Denice Borshris Chandler Tech., Technician, Orvilla FusSarah                            Cato, CRNA Referring MD:              Medicines:                Propofol per Anesthesia Complications:            No immediate complications. Estimated Blood Loss:     Estimated blood loss: none. Procedure:                Pre-Anesthesia Assessment:                           - Prior to the procedure, a History and Physical                            was performed, and patient medications and                            allergies were reviewed. The patient's tolerance of                            previous anesthesia was also reviewed. The risks                            and benefits of the procedure and the sedation                            options and risks were discussed with the patient.                            All questions were answered, and informed consent                            was obtained. Prior Anticoagulants: The patient has                            taken no previous anticoagulant or antiplatelet                            agents. ASA Grade Assessment: III - A patient with                            severe systemic  disease. After reviewing the risks                            and benefits, the patient was deemed in                            satisfactory condition to undergo the procedure.                           - Sedation was administered by an anesthesia                            professional. Deep sedation was attained.                           After obtaining informed consent, the endoscope was                             passed under direct vision. Throughout the                            procedure, the patient's blood pressure, pulse, and                            oxygen saturations were monitored continuously. The                            GIF-H190 ([id number]) Olympus gastroscope was                            introduced through the mouth, and advanced to the                            third part of duodenum. The upper GI endoscopy was                            technically difficult and complex. The patient                            tolerated the procedure well. Scope In: Scope Out: Findings:      Diffuse, white plaques were found in the upper third of the esophagus.      A large amount of food (residue) was found in the gastric body.      A moderate extrinsic deformity was found in the duodenal bulb.      In the proximal esophagus there was evidence of a diffuse Candidal       esophagitis. In the gastric lumen there was a large amount of liquid and       vegetable matter. The majority of the liquid contents was evacuated.       There was evidence of some dark blood/small clot escaping in the the       gastric lumen. The bleeding is the same type of blood in the       percutaneous tube. There was extrinsic compression of the duodenal bulb  from the distended gallbladder. There was no evidence of any ulcerations       or erosions. The melena and anemia is secondary to the gallbladder       bleeding that is draining into the percutaneous drain and the small       bowel. Impression:               - Esophageal plaques were found, consistent with                            candidiasis.                           - A large amount of food (residue) in the stomach.                           - Duodenal deformity.                           - No specimens collected. Recommendation:           - Return patient to hospital ward for ongoing care.                           - Resume regular diet.                            - Continue present medications.                           - Monitor HGB and transfuse as necessary.                           - Cholecystectomy per Surgery.                           - Diflucan 200 mg QD x 10 days. Procedure Code(s):        --- Professional ---                           [phone], Esophagogastroduodenoscopy, flexible,                            transoral; diagnostic, including collection of                            specimen(s) by brushing or washing, when performed                            (separate procedure) Diagnosis Code(s):        --- Professional ---                           K22.9, Disease of esophagus, unspecified                           K31.89, Other diseases of stomach and duodenum  K92.1, Melena (includes Hematochezia) CPT copyright 2019 [employer]. All rights reserved. The codes documented in this report are preliminary and upon coder review may  be revised to meet current compliance requirements. Jeani Hawking, MD Jeani Hawking, MD 07/29/2020 2:14:05 PM This report has been signed electronically. Number of Addenda: 0

## 2020-07-29 NOTE — Anesthesia Preprocedure Evaluation (Signed)
Anesthesia Evaluation  Patient identified by MRN, date of birth, ID band Patient awake    Reviewed: Allergy & Precautions, NPO status , Patient's Chart, lab work & pertinent test results  Airway Mallampati: II  TM Distance: >3 FB Neck ROM: Full    Dental  (+) Teeth Intact, Dental Advisory Given   Pulmonary    breath sounds clear to auscultation       Cardiovascular  Rhythm:Regular Rate:Normal     Neuro/Psych    GI/Hepatic   Endo/Other  diabetes  Renal/GU      Musculoskeletal   Abdominal   Peds  Hematology   Anesthesia Other Findings   Reproductive/Obstetrics                             Anesthesia Physical Anesthesia Plan  ASA: III  Anesthesia Plan: General   Post-op Pain Management:    Induction: Intravenous  PONV Risk Score and Plan: Ondansetron and Propofol infusion  Airway Management Planned: Natural Airway and Nasal Cannula  Additional Equipment:   Intra-op Plan:   Post-operative Plan:   Informed Consent: I have reviewed the patients History and Physical, chart, labs and discussed the procedure including the risks, benefits and alternatives for the proposed anesthesia with the patient or authorized representative who has indicated his/her understanding and acceptance.     Dental advisory given  Plan Discussed with: CRNA and Anesthesiologist  Anesthesia Plan Comments:         Anesthesia Quick Evaluation

## 2020-07-29 NOTE — Interval H&P Note (Signed)
History and Physical Interval Note:  07/29/2020 1:36 PM  Billy Cowan  has presented today for surgery, with the diagnosis of Melena and anemia.  The various methods of treatment have been discussed with the patient and family. After consideration of risks, benefits and other options for treatment, the patient has consented to  Procedure(s): ESOPHAGOGASTRODUODENOSCOPY (EGD) WITH PROPOFOL (N/A) as a surgical intervention.  The patient's history has been reviewed, patient examined, no change in status, stable for surgery.  I have reviewed the patient's chart and labs.  Questions were answered to the patient's satisfaction.     Anastasija Anfinson D
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
## 2020-07-29 NOTE — Transfer of Care (Signed)
Immediate Anesthesia Transfer of Care Note  Patient: Billy Cowan  Procedure(s) Performed: ESOPHAGOGASTRODUODENOSCOPY (EGD) WITH PROPOFOL (N/A )  Patient Location: Endoscopy Unit  Anesthesia Type:MAC  Level of Consciousness: awake, alert  and oriented  Airway & Oxygen Therapy: Patient Spontanous Breathing and Patient connected to nasal cannula oxygen  Post-op Assessment: Report given to RN, Post -op Vital signs reviewed and stable and Patient moving all extremities  Post vital signs: Reviewed and stable  Last Vitals:  Vitals Value Taken Time  BP 88/55 07/29/20 1405  Temp 37.2 C 07/29/20 1405  Pulse 102 07/29/20 1406  Resp 19 07/29/20 1406  SpO2 100 % 07/29/20 1406  Vitals shown include unvalidated device data.  Last Pain:  Vitals:   07/29/20 1405  TempSrc: Temporal  PainSc: 0-No pain      Patients Stated Pain Goal: 0 (03/40/35 2481)  Complications: No complications documented.

## 2020-07-30 ENCOUNTER — Encounter (HOSPITAL_COMMUNITY): Payer: Self-pay | Admitting: Gastroenterology

## 2020-07-30 ENCOUNTER — Inpatient Hospital Stay (HOSPITAL_COMMUNITY): Payer: 59

## 2020-07-30 DIAGNOSIS — K922 Gastrointestinal hemorrhage, unspecified: Secondary | ICD-10-CM | POA: Diagnosis not present

## 2020-07-30 DIAGNOSIS — K851 Biliary acute pancreatitis without necrosis or infection: Secondary | ICD-10-CM | POA: Diagnosis not present

## 2020-07-30 HISTORY — PX: IR US GUIDE VASC ACCESS RIGHT: IMG2390

## 2020-07-30 HISTORY — PX: IR EMBO ART  VEN HEMORR LYMPH EXTRAV  INC GUIDE ROADMAPPING: IMG5450

## 2020-07-30 HISTORY — PX: IR ANGIOGRAM SELECTIVE EACH ADDITIONAL VESSEL: IMG667

## 2020-07-30 HISTORY — PX: IR EXCHANGE BILIARY DRAIN: IMG6046

## 2020-07-30 LAB — CBC WITH DIFFERENTIAL/PLATELET
Abs Immature Granulocytes: 2.27 10*3/uL — ABNORMAL HIGH (ref 0.00–0.07)
Basophils Absolute: 0.1 10*3/uL (ref 0.0–0.1)
Basophils Relative: 0 %
Eosinophils Absolute: 0.1 10*3/uL (ref 0.0–0.5)
Eosinophils Relative: 0 %
HCT: 21.8 % — ABNORMAL LOW (ref 39.0–52.0)
Hemoglobin: 7.1 g/dL — ABNORMAL LOW (ref 13.0–17.0)
Immature Granulocytes: 9 %
Lymphocytes Relative: 12 %
Lymphs Abs: 3.1 10*3/uL (ref 0.7–4.0)
MCH: 29.7 pg (ref 26.0–34.0)
MCHC: 32.6 g/dL (ref 30.0–36.0)
MCV: 91.2 fL (ref 80.0–100.0)
Monocytes Absolute: 1.8 10*3/uL — ABNORMAL HIGH (ref 0.1–1.0)
Monocytes Relative: 7 %
Neutro Abs: 17.8 10*3/uL — ABNORMAL HIGH (ref 1.7–7.7)
Neutrophils Relative %: 72 %
Platelets: 684 10*3/uL — ABNORMAL HIGH (ref 150–400)
RBC: 2.39 MIL/uL — ABNORMAL LOW (ref 4.22–5.81)
RDW: 16.4 % — ABNORMAL HIGH (ref 11.5–15.5)
WBC: 24.9 10*3/uL — ABNORMAL HIGH (ref 4.0–10.5)
nRBC: 0.2 % (ref 0.0–0.2)

## 2020-07-30 LAB — COMPREHENSIVE METABOLIC PANEL
ALT: 22 U/L (ref 0–44)
AST: 19 U/L (ref 15–41)
Albumin: 1.6 g/dL — ABNORMAL LOW (ref 3.5–5.0)
Alkaline Phosphatase: 160 U/L — ABNORMAL HIGH (ref 38–126)
Anion gap: 8 (ref 5–15)
BUN: 6 mg/dL (ref 6–20)
CO2: 23 mmol/L (ref 22–32)
Calcium: 8.3 mg/dL — ABNORMAL LOW (ref 8.9–10.3)
Chloride: 100 mmol/L (ref 98–111)
Creatinine, Ser: 0.52 mg/dL — ABNORMAL LOW (ref 0.61–1.24)
GFR, Estimated: >60 mL/min (ref >60)
Glucose, Bld: 272 mg/dL — ABNORMAL HIGH (ref 70–99)
Potassium: 3.7 mmol/L (ref 3.5–5.1)
Sodium: 131 mmol/L — ABNORMAL LOW (ref 135–145)
Total Bilirubin: 2.6 mg/dL — ABNORMAL HIGH (ref 0.3–1.2)
Total Protein: 5.6 g/dL — ABNORMAL LOW (ref 6.5–8.1)

## 2020-07-30 LAB — GLUCOSE, CAPILLARY
Glucose-Capillary: 253 mg/dL — ABNORMAL HIGH (ref 70–99)
Glucose-Capillary: 214 mg/dL — ABNORMAL HIGH (ref 70–99)
Glucose-Capillary: 204 mg/dL — ABNORMAL HIGH (ref 70–99)

## 2020-07-30 LAB — PREPARE RBC (CROSSMATCH)

## 2020-07-30 LAB — HEMOGLOBIN AND HEMATOCRIT, BLOOD
HCT: 28.1 % — ABNORMAL LOW (ref 39.0–52.0)
Hemoglobin: 9.1 g/dL — ABNORMAL LOW (ref 13.0–17.0)

## 2020-07-30 LAB — MAGNESIUM: Magnesium: 1.7 mg/dL (ref 1.7–2.4)

## 2020-07-30 MED ORDER — IOHEXOL 300 MG/ML  SOLN
150.0000 mL | Freq: Once | INTRAMUSCULAR | Status: AC | PRN
  Administered 2020-07-30: 35 mL via INTRA_ARTERIAL

## 2020-07-30 MED ORDER — SODIUM CHLORIDE 0.9 % IV SOLN
INTRAVENOUS | Status: AC | PRN
  Administered 2020-07-30: 250 mL via INTRAVENOUS

## 2020-07-30 MED ORDER — LIDOCAINE HCL 1 % IJ SOLN
INTRAMUSCULAR | Status: AC | PRN
  Administered 2020-07-30: 5 mL

## 2020-07-30 MED ORDER — LIDOCAINE HCL 1 % IJ SOLN
INTRAMUSCULAR | Status: AC
  Filled 2020-07-30: qty 20

## 2020-07-30 MED ORDER — IOHEXOL 300 MG/ML  SOLN
100.0000 mL | Freq: Once | INTRAMUSCULAR | Status: AC | PRN
  Administered 2020-07-30: 35 mL via INTRA_ARTERIAL

## 2020-07-30 MED ORDER — MIDAZOLAM HCL 2 MG/2ML IJ SOLN
INTRAMUSCULAR | Status: AC | PRN
Start: 2020-07-30 — End: 2020-07-30
  Administered 2020-07-30: 1 mg via INTRAVENOUS
  Administered 2020-07-30: 0.5 mg via INTRAVENOUS

## 2020-07-30 MED ORDER — MIDAZOLAM HCL 2 MG/2ML IJ SOLN
INTRAMUSCULAR | Status: AC
  Filled 2020-07-30: qty 2

## 2020-07-30 MED ORDER — MAGNESIUM OXIDE 400 (241.3 MG) MG PO TABS
800.0000 mg | ORAL_TABLET | Freq: Once | ORAL | Status: AC
  Administered 2020-07-30: 800 mg via ORAL
  Filled 2020-07-30: qty 2

## 2020-07-30 MED ORDER — SODIUM CHLORIDE 0.9% IV SOLUTION: Freq: Once | INTRAVENOUS | Status: AC

## 2020-07-30 MED ORDER — FENTANYL CITRATE (PF) 100 MCG/2ML IJ SOLN
INTRAMUSCULAR | Status: AC
  Filled 2020-07-30: qty 2

## 2020-07-30 MED ORDER — FENTANYL CITRATE (PF) 100 MCG/2ML IJ SOLN
INTRAMUSCULAR | Status: AC | PRN
  Administered 2020-07-30 (×3): 25 ug via INTRAVENOUS

## 2020-07-30 NOTE — Progress Notes (Signed)
Inpatient Diabetes Program Recommendations  AACE/ADA: New Consensus Statement on Inpatient Glycemic Control (2015)  Target Ranges:  Prepandial:   less than 140 mg/dL      Peak postprandial:   less than 180 mg/dL (1-2 hours)      Critically ill patients:  140 - 180 mg/dL   Lab Results  Component Value Date   GLUCAP 253 (H) 07/30/2020   HGBA1C 7.4 (H) 07/27/2020    Review of Glycemic Control Results for Billy Cowan, Billy Cowan (MRN [id number]) as of 07/30/2020 08:40  Ref. Range 07/29/2020 07:55 07/29/2020 11:28 07/29/2020 16:42 07/29/2020 21:55 07/30/2020 06:48  Glucose-Capillary Latest Ref Range: 70 - 99 mg/dL 793 (H) 968 (H) 864 (H) 235 (H) 253 (H)    Gallstone pancreatitis Diabetes history: DM 2 Outpatient Diabetes medications: Synjardy 12.5-10002 tablets daily Current orders for Inpatient glycemic control: None  PO prednisone 10 mg Daily  Inpatient Diabetes Program Recommendations:    -  Start lantus 8-10 units while inpatient.  Thanks,  Christena Deem RN, MSN, BC-ADM Inpatient Diabetes Coordinator Team Pager [phone] (8a-5p)

## 2020-07-30 NOTE — Plan of Care (Signed)
  Problem: Activity: Goal: Risk for activity intolerance will decrease Outcome: Progressing   Problem: Elimination: Goal: Will not experience complications related to urinary retention Outcome: Progressing   
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
## 2020-07-30 NOTE — Sedation Documentation (Signed)
Patient transported back to inpatient room 5M06 via bed. Beside handoff completed with Marianna Fuss RN. Right groin access site assessed by both RNs. Distal pulses palpable. Care turned over at this time to [place], [place].

## 2020-07-30 NOTE — Sedation Documentation (Signed)
Pt arrived to IR suite alert and oriented x4, npo sine 0745am today, consent signed. Assisted pt to table safely and applied monitor. VSS. Prepping for procedure at this time.

## 2020-07-30 NOTE — Procedures (Signed)
Interventional Radiology Procedure Note  Procedure:  1.) Multi vessel Hepatic arteriogram 2.) Coil embolization of cystic artery 3.) Cholecystostomy tube exchange  Complications: None  Estimated Blood Loss: None  Recommendations: - Watch for clearing of hemobilia  - Continue to trend H&H and transfuse as needed.    Signed,  Sterling Big, MD

## 2020-07-30 NOTE — Plan of Care (Signed)
  Problem: Education: Goal: Knowledge of General Education information will improve Description: Including pain rating scale, medication(s)/side effects and non-pharmacologic comfort measures Outcome: Progressing   Problem: Coping: Goal: Level of anxiety will decrease Outcome: Progressing   Problem: Pain Managment: Goal: General experience of comfort will improve Outcome: Progressing   
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
## 2020-07-30 NOTE — Progress Notes (Addendum)
Wife is asking that she is contacted after any and all  procedure.  Laurier Nancy [phone].  She wants to be contacted so if the patient is not aware enough after a procedure, she will have the relevant information and details.    Wife needs to be called with any changes or with any added procedures to be made aware.

## 2020-07-31 DIAGNOSIS — K922 Gastrointestinal hemorrhage, unspecified: Secondary | ICD-10-CM | POA: Diagnosis not present

## 2020-07-31 DIAGNOSIS — K851 Biliary acute pancreatitis without necrosis or infection: Secondary | ICD-10-CM | POA: Diagnosis not present

## 2020-07-31 LAB — CBC WITH DIFFERENTIAL/PLATELET
Abs Immature Granulocytes: 0.2 10*3/uL — ABNORMAL HIGH (ref 0.00–0.07)
Basophils Absolute: 0.2 10*3/uL — ABNORMAL HIGH (ref 0.0–0.1)
Basophils Relative: 1 %
Eosinophils Absolute: 0 10*3/uL (ref 0.0–0.5)
Eosinophils Relative: 0 %
HCT: 27.1 % — ABNORMAL LOW (ref 39.0–52.0)
Hemoglobin: 8.7 g/dL — ABNORMAL LOW (ref 13.0–17.0)
Lymphocytes Relative: 10 %
Lymphs Abs: 1.9 10*3/uL (ref 0.7–4.0)
MCH: 29.3 pg (ref 26.0–34.0)
MCHC: 32.1 g/dL (ref 30.0–36.0)
MCV: 91.2 fL (ref 80.0–100.0)
Metamyelocytes Relative: 1 %
Monocytes Absolute: 0.7 10*3/uL (ref 0.1–1.0)
Monocytes Relative: 4 %
Neutro Abs: 15.6 10*3/uL — ABNORMAL HIGH (ref 1.7–7.7)
Neutrophils Relative %: 84 %
Platelets: 737 10*3/uL — ABNORMAL HIGH (ref 150–400)
RBC: 2.97 MIL/uL — ABNORMAL LOW (ref 4.22–5.81)
RDW: 16.4 % — ABNORMAL HIGH (ref 11.5–15.5)
WBC: 18.6 10*3/uL — ABNORMAL HIGH (ref 4.0–10.5)
nRBC: 0 /100 WBC
nRBC: 0.2 % (ref 0.0–0.2)

## 2020-07-31 LAB — COMPREHENSIVE METABOLIC PANEL WITH GFR
ALT: 20 U/L (ref 0–44)
AST: 20 U/L (ref 15–41)
Albumin: 1.9 g/dL — ABNORMAL LOW (ref 3.5–5.0)
Alkaline Phosphatase: 157 U/L — ABNORMAL HIGH (ref 38–126)
Anion gap: 9 (ref 5–15)
BUN: <5 mg/dL — ABNORMAL LOW (ref 6–20)
CO2: 23 mmol/L (ref 22–32)
Calcium: 8.9 mg/dL (ref 8.9–10.3)
Chloride: 100 mmol/L (ref 98–111)
Creatinine, Ser: 0.59 mg/dL — ABNORMAL LOW (ref 0.61–1.24)
GFR, Estimated: >60 mL/min
Glucose, Bld: 194 mg/dL — ABNORMAL HIGH (ref 70–99)
Potassium: 3.7 mmol/L (ref 3.5–5.1)
Sodium: 132 mmol/L — ABNORMAL LOW (ref 135–145)
Total Bilirubin: 2.5 mg/dL — ABNORMAL HIGH (ref 0.3–1.2)
Total Protein: 6.2 g/dL — ABNORMAL LOW (ref 6.5–8.1)

## 2020-07-31 LAB — TYPE AND SCREEN
ABO/RH(D): O POS
Antibody Screen: NEGATIVE
Unit division: 0
Unit division: 0

## 2020-07-31 LAB — BPAM RBC
Blood Product Expiration Date: 202204262359
Blood Product Expiration Date: 202204272359
ISSUE DATE / TIME: 202203301124
ISSUE DATE / TIME: 202204011039
Unit Type and Rh: 5100
Unit Type and Rh: 5100

## 2020-07-31 LAB — GLUCOSE, CAPILLARY
Glucose-Capillary: 176 mg/dL — ABNORMAL HIGH (ref 70–99)
Glucose-Capillary: 154 mg/dL — ABNORMAL HIGH (ref 70–99)
Glucose-Capillary: 197 mg/dL — ABNORMAL HIGH (ref 70–99)
Glucose-Capillary: 253 mg/dL — ABNORMAL HIGH (ref 70–99)

## 2020-07-31 LAB — MAGNESIUM: Magnesium: 1.8 mg/dL (ref 1.7–2.4)
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
## 2020-07-31 NOTE — Progress Notes (Signed)
Referring Physician(s): Dr. Redmond Pulling  Supervising Physician: Sandi Mariscal  Patient Status:  [facility] - In-pt  Chief Complaint: Acute cholecystitis s/p cholecystostomy 07/22/20. Acute blood loss anemia secondary to persistent hemobilia s/p multi-vessel hepatic angiogram and coil embolization of cystic artery and cholecystostomy tube exchange 07/30/20.    Subjective: Patient sitting up in recliner; he denies any pain or discomfort and states he feels very well, just hungry - lunch tray ordered. Output from cholecystostomy remains dark/bloody.   Allergies: Patient has no known allergies.  Medications: Prior to Admission medications   Medication Sig Start Date End Date Taking? Authorizing Provider  allopurinol (ZYLOPRIM) 100 MG tablet Take 100 mg by mouth daily. 06/24/20  Yes [provider]  blood glucose meter kit and supplies Dispense based on patient and insurance preference. Use up to four times daily as directed. (FOR ICD-10 E10.9, E11.9). 04/28/19  Yes Ghimire, Henreitta Leber, MD  cyclobenzaprine (FLEXERIL) 10 MG tablet Take 10 mg by mouth every 8 (eight) hours as needed for muscle spasms. 04/11/19  Yes [provider]  fenofibrate 160 MG tablet Take 160 mg by mouth daily.   Yes [provider]  Ibuprofen (ADVIL) 200 MG CAPS Take 800 mg by mouth daily as needed (pain).   Yes [provider]  omega-3 acid ethyl esters (LOVAZA) 1 G capsule Take 4 capsules (4 g total) by mouth daily. 02/25/14  Yes Jerline Pain, MD  oxyCODONE-acetaminophen (PERCOCET) 10-325 MG tablet Take 1 tablet by mouth every 6 (six) hours as needed for pain. 05/27/20  Yes [provider]  predniSONE (DELTASONE) 20 MG tablet Take 10 mg by mouth daily with breakfast. 05/26/20  Yes [provider]  SYNJARDY XR 12.08-998 MG TB24 Take 2 tablets by mouth every morning. 03/30/19  Yes [provider]     Vital Signs: BP 126/84 (BP Location: Right Arm)   Pulse 99   Temp 98  F (36.7 C) (Oral)   Resp 18   Ht _0  (1.778 m)   Wt 182 lb 1.6 oz (82.6 kg)   SpO2 100%   BMI 26.13 kg/m   Physical Exam Constitutional:      General: He is not in acute distress.    Appearance: He is not ill-appearing.  Pulmonary:     Effort: Pulmonary effort is normal.  Abdominal:     Palpations: Abdomen is soft.     Tenderness: There is no abdominal tenderness.     Comments: RUQ drain to gravity. Drain easily flushed and aspirated. Dressing is clean and dry. Approximately 15 ml of dark, bloody fluid in drain.   Skin:    General: Skin is warm and dry.  Neurological:     Mental Status: He is alert and oriented to person, place, and time.     Imaging: IR Angiogram Selective Each Additional Vessel  Result Date: 07/31/2020 INDICATION: 55 year old Cowan with a history of acute cholecystitis and gallstone pancreatitis. He underwent placement of a percutaneous cholecystostomy tube on 07/22/2020. Since that time, he has had persistent hemobilia with decreasing hemoglobin requiring transfusion. He presents today for arteriogram and biliary tube exchange in an effort to identify a treatable source of bleeding. EXAM: IR EMBO ART VEN HEMORR LYMPH EXTRAV INC GUIDE ROADMAPPING; IR EXCHANGE BILARY DRAIN; IR ULTRASOUND GUIDANCE VASC ACCESS RIGHT; ADDITIONAL ARTERIOGRAPHY MEDICATIONS: None. ANESTHESIA/SEDATION: Moderate (conscious) sedation was employed during this procedure. A total of Versed 1.5 mg and Fentanyl 75 mcg was administered intravenously. Moderate Sedation Time: 49 minutes. The  patient's level of consciousness and vital signs were monitored continuously by radiology nursing throughout the procedure under my direct supervision. CONTRAST:  81m OMNIPAQUE IOHEXOL 300 MG/ML SOLN, 38mOMNIPAQUE IOHEXOL 300 MG/ML SOLN FLUOROSCOPY TIME:  Fluoroscopy Time: 5 minutes 18 seconds (747 mGy). COMPLICATIONS: None immediate. PROCEDURE: Informed consent was obtained from the patient following  explanation of the procedure, risks, benefits and alternatives. The patient understands, agrees and consents for the procedure. All questions were addressed. A time out was performed prior to the initiation of the procedure. Maximal barrier sterile technique utilized including caps, mask, sterile gowns, sterile gloves, large sterile drape, hand hygiene, and Betadine prep. The right common femoral artery was interrogated with ultrasound and found to be widely patent. An image was obtained and stored for the medical record. Local anesthesia was attained by infiltration with 1% lidocaine. A small dermatotomy was made. Under real-time sonographic guidance, the vessel was punctured with a 21 gauge micropuncture needle. Using standard technique, the initial micro needle was exchanged over a 0.018 micro wire for a transitional 4 [place] sheath. The micro sheath was then exchanged over a 0.035 wire for a 5 French vascular sheath. A C2 cobra catheter was used to select the celiac axis. A celiac arteriogram was performed. Variant hepatic arterial anatomy is identified. The celiac axis gives rise to the common hepatic artery which then bifurcates into the left hepatic artery and the gastroduodenal artery. A large left gastric artery is present as well as the splenic artery and the inferior phrenic artery. The C2 cobra catheter was next used to select the superior mesenteric artery. An arteriogram was performed. There is a replaced right hepatic artery. Using a Glidewire, the C2 cobra catheter was advanced into the right hepatic artery. Additional arteriography was performed. The right branch of the middle colic artery arises from the replaced right hepatic artery. Attention was next turned to the percutaneous transhepatic cholecystostomy tube. The cholecystostomy tube was transected and gently removed over a 0.035 wire. Leaving just the wire in place, additional arteriography was performed in multiple obliquities. No  evidence of arterial injury of any of the peripheral right hepatic artery branches traveling in proximity to the percutaneous cholecystostomy tube. However, there is marked hyperemia along the hepatic surface of the gallbladder. This is the most prominent finding on the angiogram. At this time, further evaluation via super selective catheterization utilizing a microcatheter was felt to be necessary. Therefore, a renegade STC microcatheter was advanced over a Fathom 16 wire and used to select the segment 5 hepatic artery. Arteriography was performed in multiple obliquities. The peripheral branches of the segment 5 hepatic artery pass in close approximation with the course of the transhepatic wire. However, the arteries appear in tire Lea normal with no evidence of spasm, irregularity or pseudoaneurysm. No active bleeding is identified. Given the significant hyperemia along the hepatic surface of the gallbladder, the decision was made to proceed with further evaluation. The microcatheter was successfully advanced into the cystic artery and additional arteriography was performed. No evidence of active bleeding. There is hyperemia along the hepatic surface of the gallbladder as previously noted. Given the patient's persistent hemobilia and the high risk of surgical cholecystectomy, the decision was made to proceed with proximal embolization of the cystic artery in an effort to decrease arterial perfusion to the region of hyperemia in the hopes that this would decrease further bleeding. Coil embolization was performed using two 2x6 fibered interlock detachable coils. Follow-up arteriography demonstrates successful decreased flow in  the cystic artery and significantly decreased hyperemia at the hepatic to gallbladder interface. Again, no active bleeding identified on the final images. Attention was returned to the percutaneous biliary drainage catheter. A new Cook 10 [place] all-purpose biliary drain was advanced over the  wire and into the gallbladder lumen a gentle hand injection of contrast material opacifies the gallbladder lumen which is significantly distended by thrombus. The catheter was gently flushed, connected to bag drainage and secured to the skin with 0 Prolene suture. The catheter and sheath were removed and hemostasis attained with the assistance of a Celt closure device. IMPRESSION: 1. No evidence of arterial injury related to the percutaneous transhepatic cholecystostomy tube. 2. There is marked hyperemia along the hepatic surface of the gallbladder. This suggests that bleeding may be coming from an irritated and friable gallbladder wall. 3. Therefore, proximal coil embolization of the cystic artery was performed. On final angiography the significantly decreased the degree of hyperemia along the hepatic surface of the gallbladder. 4. Successful exchange of 10 French percutaneous transhepatic cholecystostomy tube. PLAN: 1. Continue to trend H and H and transfuse as needed. 2. Follow cholecystostomy tube output. If hemobilia begins to resolve, then we will know the procedure was successful. If hemobilia persists in additional transfusions are required, patient may ultimately require cholecystectomy. Signed, Criselda Peaches, MD, [facility] Electronically Signed   By: Jacqulynn Cadet M.D.   On: 07/31/2020 09:26   IR US Guide Vasc Access Right  Result Date: 07/31/2020 INDICATION: 55 year old Cowan with a history of acute cholecystitis and gallstone pancreatitis. He underwent placement of a percutaneous cholecystostomy tube on 07/22/2020. Since that time, he has had persistent hemobilia with decreasing hemoglobin requiring transfusion. He presents today for arteriogram and biliary tube exchange in an effort to identify a treatable source of bleeding. EXAM: IR EMBO ART VEN HEMORR LYMPH EXTRAV INC GUIDE ROADMAPPING; IR EXCHANGE BILARY DRAIN; IR  ULTRASOUND GUIDANCE VASC ACCESS RIGHT; ADDITIONAL ARTERIOGRAPHY MEDICATIONS: None. ANESTHESIA/SEDATION: Moderate (conscious) sedation was employed during this procedure. A total of Versed 1.5 mg and Fentanyl 75 mcg was administered intravenously. Moderate Sedation Time: 49 minutes. The patient's level of consciousness and vital signs were monitored continuously by radiology nursing throughout the procedure under my direct supervision. CONTRAST:  30m OMNIPAQUE IOHEXOL 300 MG/ML SOLN, 323mOMNIPAQUE IOHEXOL 300 MG/ML SOLN FLUOROSCOPY TIME:  Fluoroscopy Time: 5 minutes 18 seconds (747 mGy). COMPLICATIONS: None immediate. PROCEDURE: Informed consent was obtained from the patient following explanation of the procedure, risks, benefits and alternatives. The patient understands, agrees and consents for the procedure. All questions were addressed. A time out was performed prior to the initiation of the procedure. Maximal barrier sterile technique utilized including caps, mask, sterile gowns, sterile gloves, large sterile drape, hand hygiene, and Betadine prep. The right common femoral artery was interrogated with ultrasound and found to be widely patent. An image was obtained and stored for the medical record. Local anesthesia was attained by infiltration with 1% lidocaine. A small dermatotomy was made. Under real-time sonographic guidance, the vessel was punctured with a 21 gauge micropuncture needle. Using standard technique, the initial micro needle was exchanged over a 0.018 micro wire for a transitional 4 [place] sheath. The micro sheath was then exchanged over a 0.035 wire for a 5 French vascular sheath. A C2 cobra catheter was used to select the celiac axis. A celiac arteriogram was performed. Variant hepatic arterial anatomy is identified. The celiac axis gives rise to the common  hepatic artery which then bifurcates into the left hepatic artery and the gastroduodenal artery. A large left gastric artery is  present as well as the splenic artery and the inferior phrenic artery. The C2 cobra catheter was next used to select the superior mesenteric artery. An arteriogram was performed. There is a replaced right hepatic artery. Using a Glidewire, the C2 cobra catheter was advanced into the right hepatic artery. Additional arteriography was performed. The right branch of the middle colic artery arises from the replaced right hepatic artery. Attention was next turned to the percutaneous transhepatic cholecystostomy tube. The cholecystostomy tube was transected and gently removed over a 0.035 wire. Leaving just the wire in place, additional arteriography was performed in multiple obliquities. No evidence of arterial injury of any of the peripheral right hepatic artery branches traveling in proximity to the percutaneous cholecystostomy tube. However, there is marked hyperemia along the hepatic surface of the gallbladder. This is the most prominent finding on the angiogram. At this time, further evaluation via super selective catheterization utilizing a microcatheter was felt to be necessary. Therefore, a renegade STC microcatheter was advanced over a Fathom 16 wire and used to select the segment 5 hepatic artery. Arteriography was performed in multiple obliquities. The peripheral branches of the segment 5 hepatic artery pass in close approximation with the course of the transhepatic wire. However, the arteries appear in tire Lea normal with no evidence of spasm, irregularity or pseudoaneurysm. No active bleeding is identified. Given the significant hyperemia along the hepatic surface of the gallbladder, the decision was made to proceed with further evaluation. The microcatheter was successfully advanced into the cystic artery and additional arteriography was performed. No evidence of active bleeding. There is hyperemia along the hepatic surface of the gallbladder as previously noted. Given the patient's persistent hemobilia  and the high risk of surgical cholecystectomy, the decision was made to proceed with proximal embolization of the cystic artery in an effort to decrease arterial perfusion to the region of hyperemia in the hopes that this would decrease further bleeding. Coil embolization was performed using two 2x6 fibered interlock detachable coils. Follow-up arteriography demonstrates successful decreased flow in the cystic artery and significantly decreased hyperemia at the hepatic to gallbladder interface. Again, no active bleeding identified on the final images. Attention was returned to the percutaneous biliary drainage catheter. A new Cook 10 [place] all-purpose biliary drain was advanced over the wire and into the gallbladder lumen a gentle hand injection of contrast material opacifies the gallbladder lumen which is significantly distended by thrombus. The catheter was gently flushed, connected to bag drainage and secured to the skin with 0 Prolene suture. The catheter and sheath were removed and hemostasis attained with the assistance of a Celt closure device. IMPRESSION: 1. No evidence of arterial injury related to the percutaneous transhepatic cholecystostomy tube. 2. There is marked hyperemia along the hepatic surface of the gallbladder. This suggests that bleeding may be coming from an irritated and friable gallbladder wall. 3. Therefore, proximal coil embolization of the cystic artery was performed. On final angiography the significantly decreased the degree of hyperemia along the hepatic surface of the gallbladder. 4. Successful exchange of 10 French percutaneous transhepatic cholecystostomy tube. PLAN: 1. Continue to trend H and H and transfuse as needed. 2. Follow cholecystostomy tube output. If hemobilia begins to resolve, then we will know the procedure was successful. If hemobilia persists in additional transfusions are required, patient may ultimately require cholecystectomy. Signed, Criselda Peaches, MD,  [facility] Electronically Signed   By: Jacqulynn Cadet M.D.   On: 07/31/2020 09:26   IR EXCHANGE BILIARY DRAIN  Result Date: 07/31/2020 INDICATION: 55 year old Cowan with a history of acute cholecystitis and gallstone pancreatitis. He underwent placement of a percutaneous cholecystostomy tube on 07/22/2020. Since that time, he has had persistent hemobilia with decreasing hemoglobin requiring transfusion. He presents today for arteriogram and biliary tube exchange in an effort to identify a treatable source of bleeding. EXAM: IR EMBO ART VEN HEMORR LYMPH EXTRAV INC GUIDE ROADMAPPING; IR EXCHANGE BILARY DRAIN; IR ULTRASOUND GUIDANCE VASC ACCESS RIGHT; ADDITIONAL ARTERIOGRAPHY MEDICATIONS: None. ANESTHESIA/SEDATION: Moderate (conscious) sedation was employed during this procedure. A total of Versed 1.5 mg and Fentanyl 75 mcg was administered intravenously. Moderate Sedation Time: 49 minutes. The patient's level of consciousness and vital signs were monitored continuously by radiology nursing throughout the procedure under my direct supervision. CONTRAST:  55m OMNIPAQUE IOHEXOL 300 MG/ML SOLN, 315mOMNIPAQUE IOHEXOL 300 MG/ML SOLN FLUOROSCOPY TIME:  Fluoroscopy Time: 5 minutes 18 seconds (747 mGy). COMPLICATIONS: None immediate. PROCEDURE: Informed consent was obtained from the patient following explanation of the procedure, risks, benefits and alternatives. The patient understands, agrees and consents for the procedure. All questions were addressed. A time out was performed prior to the initiation of the procedure. Maximal barrier sterile technique utilized including caps, mask, sterile gowns, sterile gloves, large sterile drape, hand hygiene, and Betadine prep. The right common femoral artery was interrogated with ultrasound and found to be widely patent. An image was obtained and stored for the medical record. Local anesthesia was attained by  infiltration with 1% lidocaine. A small dermatotomy was made. Under real-time sonographic guidance, the vessel was punctured with a 21 gauge micropuncture needle. Using standard technique, the initial micro needle was exchanged over a 0.018 micro wire for a transitional 4 [place] sheath. The micro sheath was then exchanged over a 0.035 wire for a 5 French vascular sheath. A C2 cobra catheter was used to select the celiac axis. A celiac arteriogram was performed. Variant hepatic arterial anatomy is identified. The celiac axis gives rise to the common hepatic artery which then bifurcates into the left hepatic artery and the gastroduodenal artery. A large left gastric artery is present as well as the splenic artery and the inferior phrenic artery. The C2 cobra catheter was next used to select the superior mesenteric artery. An arteriogram was performed. There is a replaced right hepatic artery. Using a Glidewire, the C2 cobra catheter was advanced into the right hepatic artery. Additional arteriography was performed. The right branch of the middle colic artery arises from the replaced right hepatic artery. Attention was next turned to the percutaneous transhepatic cholecystostomy tube. The cholecystostomy tube was transected and gently removed over a 0.035 wire. Leaving just the wire in place, additional arteriography was performed in multiple obliquities. No evidence of arterial injury of any of the peripheral right hepatic artery branches traveling in proximity to the percutaneous cholecystostomy tube. However, there is marked hyperemia along the hepatic surface of the gallbladder. This is the most prominent finding on the angiogram. At this time, further evaluation via super selective catheterization utilizing a microcatheter was felt to be necessary. Therefore, a renegade STC microcatheter was advanced over a Fathom 16 wire and used to select the segment 5 hepatic artery. Arteriography was performed in  multiple obliquities. The peripheral branches of the segment 5 hepatic artery pass in close approximation with the course of the transhepatic wire.  However, the arteries appear in tire Lea normal with no evidence of spasm, irregularity or pseudoaneurysm. No active bleeding is identified. Given the significant hyperemia along the hepatic surface of the gallbladder, the decision was made to proceed with further evaluation. The microcatheter was successfully advanced into the cystic artery and additional arteriography was performed. No evidence of active bleeding. There is hyperemia along the hepatic surface of the gallbladder as previously noted. Given the patient's persistent hemobilia and the high risk of surgical cholecystectomy, the decision was made to proceed with proximal embolization of the cystic artery in an effort to decrease arterial perfusion to the region of hyperemia in the hopes that this would decrease further bleeding. Coil embolization was performed using two 2x6 fibered interlock detachable coils. Follow-up arteriography demonstrates successful decreased flow in the cystic artery and significantly decreased hyperemia at the hepatic to gallbladder interface. Again, no active bleeding identified on the final images. Attention was returned to the percutaneous biliary drainage catheter. A new Cook 10 [place] all-purpose biliary drain was advanced over the wire and into the gallbladder lumen a gentle hand injection of contrast material opacifies the gallbladder lumen which is significantly distended by thrombus. The catheter was gently flushed, connected to bag drainage and secured to the skin with 0 Prolene suture. The catheter and sheath were removed and hemostasis attained with the assistance of a Celt closure device. IMPRESSION: 1. No evidence of arterial injury related to the percutaneous transhepatic cholecystostomy tube. 2. There is marked hyperemia along the hepatic surface of the gallbladder.  This suggests that bleeding may be coming from an irritated and friable gallbladder wall. 3. Therefore, proximal coil embolization of the cystic artery was performed. On final angiography the significantly decreased the degree of hyperemia along the hepatic surface of the gallbladder. 4. Successful exchange of 10 French percutaneous transhepatic cholecystostomy tube. PLAN: 1. Continue to trend H and H and transfuse as needed. 2. Follow cholecystostomy tube output. If hemobilia begins to resolve, then we will know the procedure was successful. If hemobilia persists in additional transfusions are required, patient may ultimately require cholecystectomy. Signed, Criselda Peaches, MD, [facility] Electronically Signed   By: Jacqulynn Cadet M.D.   On: 07/31/2020 09:26   IR EMBO ART  VEN HEMORR LYMPH EXTRAV  INC GUIDE ROADMAPPING  Result Date: 07/31/2020 INDICATION: 55 year old Cowan with a history of acute cholecystitis and gallstone pancreatitis. He underwent placement of a percutaneous cholecystostomy tube on 07/22/2020. Since that time, he has had persistent hemobilia with decreasing hemoglobin requiring transfusion. He presents today for arteriogram and biliary tube exchange in an effort to identify a treatable source of bleeding. EXAM: IR EMBO ART VEN HEMORR LYMPH EXTRAV INC GUIDE ROADMAPPING; IR EXCHANGE BILARY DRAIN; IR ULTRASOUND GUIDANCE VASC ACCESS RIGHT; ADDITIONAL ARTERIOGRAPHY MEDICATIONS: None. ANESTHESIA/SEDATION: Moderate (conscious) sedation was employed during this procedure. A total of Versed 1.5 mg and Fentanyl 75 mcg was administered intravenously. Moderate Sedation Time: 49 minutes. The patient's level of consciousness and vital signs were monitored continuously by radiology nursing throughout the procedure under my direct supervision. CONTRAST:  24m OMNIPAQUE IOHEXOL 300 MG/ML SOLN, 337mOMNIPAQUE IOHEXOL 300 MG/ML SOLN  FLUOROSCOPY TIME:  Fluoroscopy Time: 5 minutes 18 seconds (747 mGy). COMPLICATIONS: None immediate. PROCEDURE: Informed consent was obtained from the patient following explanation of the procedure, risks, benefits and alternatives. The patient understands, agrees and consents for the procedure. All questions were addressed. A time out was performed prior to the initiation  of the procedure. Maximal barrier sterile technique utilized including caps, mask, sterile gowns, sterile gloves, large sterile drape, hand hygiene, and Betadine prep. The right common femoral artery was interrogated with ultrasound and found to be widely patent. An image was obtained and stored for the medical record. Local anesthesia was attained by infiltration with 1% lidocaine. A small dermatotomy was made. Under real-time sonographic guidance, the vessel was punctured with a 21 gauge micropuncture needle. Using standard technique, the initial micro needle was exchanged over a 0.018 micro wire for a transitional 4 [place] micro sheath. The micro sheath was then exchanged over a 0.035 wire for a 5 French vascular sheath. A C2 cobra catheter was used to select the celiac axis. A celiac arteriogram was performed. Variant hepatic arterial anatomy is identified. The celiac axis gives rise to the common hepatic artery which then bifurcates into the left hepatic artery and the gastroduodenal artery. A large left gastric artery is present as well as the splenic artery and the inferior phrenic artery. The C2 cobra catheter was next used to select the superior mesenteric artery. An arteriogram was performed. There is a replaced right hepatic artery. Using a Glidewire, the C2 cobra catheter was advanced into the right hepatic artery. Additional arteriography was performed. The right branch of the middle colic artery arises from the replaced right hepatic artery. Attention was next turned to the percutaneous transhepatic cholecystostomy tube. The  cholecystostomy tube was transected and gently removed over a 0.035 wire. Leaving just the wire in place, additional arteriography was performed in multiple obliquities. No evidence of arterial injury of any of the peripheral right hepatic artery branches traveling in proximity to the percutaneous cholecystostomy tube. However, there is marked hyperemia along the hepatic surface of the gallbladder. This is the most prominent finding on the angiogram. At this time, further evaluation via super selective catheterization utilizing a microcatheter was felt to be necessary. Therefore, a renegade STC microcatheter was advanced over a Fathom 16 wire and used to select the segment 5 hepatic artery. Arteriography was performed in multiple obliquities. The peripheral branches of the segment 5 hepatic artery pass in close approximation with the course of the transhepatic wire. However, the arteries appear in tire [place]a normal with no evidence of spasm, irregularity or pseudoaneurysm. No active bleeding is identified. Given the significant hyperemia along the hepatic surface of the gallbladder, the decision was made to proceed with further evaluation. The microcatheter was successfully advanced into the cystic artery and additional arteriography was performed. No evidence of active bleeding. There is hyperemia along the hepatic surface of the gallbladder as previously noted. Given the patient's persistent hemobilia and the high risk of surgical cholecystectomy, the decision was made to proceed with proximal embolization of the cystic artery in an effort to decrease arterial perfusion to the region of hyperemia in the hopes that this would decrease further bleeding. Coil embolization was performed using two 2x6 fibered interlock detachable coils. Follow-up arteriography demonstrates successful decreased flow in the cystic artery and significantly decreased hyperemia at the hepatic to gallbladder interface. Again, no active  bleeding identified on the final images. Attention was returned to the percutaneous biliary drainage catheter. A new Cook 10 [place] all-purpose biliary drain was advanced over the wire and into the gallbladder lumen a gentle hand injection of contrast material opacifies the gallbladder lumen which is significantly distended by thrombus. The catheter was gently flushed, connected to bag drainage and secured to the skin with 0 Prolene suture. The catheter and  sheath were removed and hemostasis attained with the assistance of a Celt closure device. IMPRESSION: 1. No evidence of arterial injury related to the percutaneous transhepatic cholecystostomy tube. 2. There is marked hyperemia along the hepatic surface of the gallbladder. This suggests that bleeding may be coming from an irritated and friable gallbladder wall. 3. Therefore, proximal coil embolization of the cystic artery was performed. On final angiography the significantly decreased the degree of hyperemia along the hepatic surface of the gallbladder. 4. Successful exchange of 10 French percutaneous transhepatic cholecystostomy tube. PLAN: 1. Continue to trend H and H and transfuse as needed. 2. Follow cholecystostomy tube output. If hemobilia begins to resolve, then we will know the procedure was successful. If hemobilia persists in additional transfusions are required, patient may ultimately require cholecystectomy. Signed, Criselda Peaches, MD, [facility] Electronically Signed   By: Jacqulynn Cadet M.D.   On: 07/31/2020 09:26   VAS [place] LOWER EXTREMITY VENOUS (DVT)  Result Date: 07/28/2020  Lower Venous DVT Study Indications: Swelling LT>RT.  Comparison Study: No prior studies. Performing Technologist: Darlin Coco RDMS,RVT  Examination Guidelines: A complete evaluation includes B-mode imaging, spectral Doppler, color Doppler, and power Doppler as needed of all accessible portions of  each vessel. Bilateral testing is considered an integral part of a complete examination. Limited examinations for reoccurring indications may be performed as noted. The reflux portion of the exam is performed with the patient in reverse Trendelenburg.  +---------+---------------+---------+-----------+----------+--------------+ RIGHT    CompressibilityPhasicitySpontaneityPropertiesThrombus Aging +---------+---------------+---------+-----------+----------+--------------+ CFV      Full           Yes      Yes                                 +---------+---------------+---------+-----------+----------+--------------+ SFJ      Full                                                        +---------+---------------+---------+-----------+----------+--------------+ FV Prox  Full                                                        +---------+---------------+---------+-----------+----------+--------------+ FV Mid   Full                                                        +---------+---------------+---------+-----------+----------+--------------+ FV DistalFull                                                        +---------+---------------+---------+-----------+----------+--------------+ PFV      Full                                                        +---------+---------------+---------+-----------+----------+--------------+  POP      Full           Yes      Yes                                 +---------+---------------+---------+-----------+----------+--------------+ PTV      Full                                                        +---------+---------------+---------+-----------+----------+--------------+ PERO     Full                                                        +---------+---------------+---------+-----------+----------+--------------+   +---------+---------------+---------+-----------+----------+--------------+ LEFT      CompressibilityPhasicitySpontaneityPropertiesThrombus Aging +---------+---------------+---------+-----------+----------+--------------+ CFV      Full           Yes      Yes                                 +---------+---------------+---------+-----------+----------+--------------+ SFJ      Full                                                        +---------+---------------+---------+-----------+----------+--------------+ FV Prox  Full                                                        +---------+---------------+---------+-----------+----------+--------------+ FV Mid   Full                                                        +---------+---------------+---------+-----------+----------+--------------+ FV DistalFull                                                        +---------+---------------+---------+-----------+----------+--------------+ PFV      Full                                                        +---------+---------------+---------+-----------+----------+--------------+ POP      Full           Yes      Yes                                 +---------+---------------+---------+-----------+----------+--------------+  PTV      Full                                                        +---------+---------------+---------+-----------+----------+--------------+ PERO     Full                                                        +---------+---------------+---------+-----------+----------+--------------+     *See table(s) above for measurements and observations. Electronically signed by Harold Barban MD on 07/28/2020 at 10:42:52 PM.    Final     Labs:  CBC: Recent Labs    07/28/20 0716 07/28/20 1627 03/Billy/22 0418 07/30/20 0336 07/30/20 2014 07/31/20 0227  WBC 37.6*  --  Billy.8* 24.9*  --  18.6*  HGB 6.7*   < > 8.3* 7.1* 9.1* 8.7*  HCT 20.4*   < > 25.6* 21.8* 28.1* 27.1*  PLT 557*  --  640* 684*  --  737*   < > = values  in this interval not displayed.    COAGS: Recent Labs    07/19/20 0420  INR 1.1    BMP: Recent Labs    06/02/20 1547 07/19/20 0250 07/28/20 0716 03/Billy/22 0418 07/30/20 0336 07/31/20 0227  NA 130*   < > 132* 132* 131* 132*  K 4.9   < > 3.6 3.4* 3.7 3.7  CL 97*   < > 101 100 100 100  CO2 17*   < > _0 GLUCOSE 291*   < > 199* 220* 272* 194*  BUN Billy*   < > 6 <5* 6 <5*  CALCIUM 10.0   < > 8.6* 8.7* 8.3* 8.9  CREATININE 0.94   < > 0.61 0.57* 0.52* 0.59*  GFRNONAA 92   < > >60 >60 >60 >60  GFRAA 107  --   --   --   --   --    < > = values in this interval not displayed.    LIVER FUNCTION TESTS: Recent Labs    07/28/20 0716 03/Billy/22 0418 07/30/20 0336 07/31/20 0227  BILITOT 3.0* 3.0* 2.6* 2.5*  AST _1 ALT _2 ALKPHOS 183* 175* 160* 157*  PROT 5.5* 5.9* 5.6* 6.2*  ALBUMIN 1.5* 1.7* 1.6* 1.9*    Assessment and Plan:  Acute cholecystitis s/p cholecystostomy 07/22/20. Acute blood loss anemia secondary to persistent hemobilia s/p multi-vessel hepatic angiogram with coil embolization of cystic artery and cholecystostomy tube exchange 07/30/20.   Patient is afebrile with stable vital signs. Hemoglobin is 8.7. Per Epic, drain output is 75 ml for the past 24 hours with approximately 15 ml of dark bloody fluid in gravity bag. Drain easily flushed and aspirated.   Patient states he has tentative plans to discharge home this weekend. I did drain care teaching with the patient and will include this information on the AVS. He will need saline flushes at discharge.   Outpatient IR follow up order also in place. Per notes from Surgery, pending plans for cholecystectomy as an outpatient. IR will continue to follow while inpatient. Other plans per primary teams.   Electronically Signed: Roselyn Reef  Manuella Ghazi [phone] 07/31/2020, 2:32 PM   I spent a total of 15 Minutes at the the patient's bedside AND on the patient's [facility] floor or unit,  greater than 50% of which was counseling/coordinating care for cholecystostomy care/embolization follow up.

## 2020-08-01 DIAGNOSIS — K922 Gastrointestinal hemorrhage, unspecified: Secondary | ICD-10-CM | POA: Diagnosis not present

## 2020-08-01 DIAGNOSIS — K851 Biliary acute pancreatitis without necrosis or infection: Secondary | ICD-10-CM | POA: Diagnosis not present

## 2020-08-01 DIAGNOSIS — G8921 Chronic pain due to trauma: Secondary | ICD-10-CM

## 2020-08-01 DIAGNOSIS — D62 Acute posthemorrhagic anemia: Secondary | ICD-10-CM

## 2020-08-01 DIAGNOSIS — D75839 Thrombocytosis, unspecified: Secondary | ICD-10-CM

## 2020-08-01 LAB — CBC WITH DIFFERENTIAL/PLATELET
Abs Immature Granulocytes: 0 10*3/uL (ref 0.00–0.07)
Basophils Absolute: 0.5 10*3/uL — ABNORMAL HIGH (ref 0.0–0.1)
Basophils Relative: 3 %
Eosinophils Absolute: 0 10*3/uL (ref 0.0–0.5)
Eosinophils Relative: 0 %
HCT: 27.7 % — ABNORMAL LOW (ref 39.0–52.0)
Hemoglobin: 8.8 g/dL — ABNORMAL LOW (ref 13.0–17.0)
Lymphocytes Relative: 9 %
Lymphs Abs: 1.6 10*3/uL (ref 0.7–4.0)
MCH: 29.4 pg (ref 26.0–34.0)
MCHC: 31.8 g/dL (ref 30.0–36.0)
MCV: 92.6 fL (ref 80.0–100.0)
Monocytes Absolute: 1.4 10*3/uL — ABNORMAL HIGH (ref 0.1–1.0)
Monocytes Relative: 8 %
Neutro Abs: 13.8 10*3/uL — ABNORMAL HIGH (ref 1.7–7.7)
Neutrophils Relative %: 80 %
Platelets: 800 10*3/uL — ABNORMAL HIGH (ref 150–400)
RBC: 2.99 MIL/uL — ABNORMAL LOW (ref 4.22–5.81)
RDW: 16.3 % — ABNORMAL HIGH (ref 11.5–15.5)
WBC: 17.3 10*3/uL — ABNORMAL HIGH (ref 4.0–10.5)
nRBC: 0 % (ref 0.0–0.2)
nRBC: 0 /100 WBC

## 2020-08-01 LAB — COMPREHENSIVE METABOLIC PANEL
ALT: 21 U/L (ref 0–44)
AST: 22 U/L (ref 15–41)
Albumin: 1.9 g/dL — ABNORMAL LOW (ref 3.5–5.0)
Alkaline Phosphatase: 132 U/L — ABNORMAL HIGH (ref 38–126)
Anion gap: 9 (ref 5–15)
BUN: 5 mg/dL — ABNORMAL LOW (ref 6–20)
CO2: 22 mmol/L (ref 22–32)
Calcium: 8.9 mg/dL (ref 8.9–10.3)
Chloride: 103 mmol/L (ref 98–111)
Creatinine, Ser: 0.6 mg/dL — ABNORMAL LOW (ref 0.61–1.24)
GFR, Estimated: >60 mL/min (ref >60)
Glucose, Bld: 205 mg/dL — ABNORMAL HIGH (ref 70–99)
Potassium: 3.9 mmol/L (ref 3.5–5.1)
Sodium: 134 mmol/L — ABNORMAL LOW (ref 135–145)
Total Bilirubin: 2.1 mg/dL — ABNORMAL HIGH (ref 0.3–1.2)
Total Protein: 6 g/dL — ABNORMAL LOW (ref 6.5–8.1)

## 2020-08-01 LAB — GLUCOSE, CAPILLARY
Glucose-Capillary: 216 mg/dL — ABNORMAL HIGH (ref 70–99)
Glucose-Capillary: 196 mg/dL — ABNORMAL HIGH (ref 70–99)

## 2020-08-01 LAB — MAGNESIUM: Magnesium: 1.7 mg/dL (ref 1.7–2.4)

## 2020-08-01 MED ORDER — FERROUS SULFATE 325 (65 FE) MG PO TABS: 325.0000 mg | ORAL_TABLET | Freq: Every day | ORAL | Status: DC

## 2020-08-01 MED ORDER — POLYETHYLENE GLYCOL 3350 17 G PO PACK: 17.0000 g | PACK | Freq: Every day | ORAL | 0 refills | Status: DC | PRN

## 2020-08-01 MED ORDER — OXYCODONE HCL ER 15 MG PO T12A: 15.0000 mg | EXTENDED_RELEASE_TABLET | Freq: Two times a day (BID) | ORAL | 0 refills | Status: AC

## 2020-08-01 MED ORDER — DOCUSATE SODIUM 100 MG PO CAPS: 100.0000 mg | ORAL_CAPSULE | Freq: Two times a day (BID) | ORAL | 0 refills | Status: AC | PRN

## 2020-08-01 MED ORDER — SODIUM CHLORIDE 0.9 % IJ SOLN: INTRAMUSCULAR | 1 refills | Status: DC

## 2020-08-01 NOTE — Progress Notes (Signed)
DISCHARGE NOTE HOME JAQUISE FAUX to be discharged Home per MD order. Discussed prescriptions and follow up appointments with the patient. Prescriptions given to patient; medication list explained in detail. Patient verbalized understanding.  Skin clean, dry and intact without evidence of skin break down, no evidence of skin tears noted. IV catheter discontinued intact. Site without signs and symptoms of complications. Dressing and pressure applied. Pt denies pain at the site currently. No complaints noted.  Patient discharged with RUQ drain to gravity. Drain easily flushed and aspirated. Dressing is clean, dry and intact.  An After Visit Summary (AVS) was printed and given to the patient. Patient escorted via wheelchair, and discharged home via private auto.  Annia Belt, RN

## 2020-08-01 NOTE — Discharge Summary (Addendum)
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 advanced into the right hepatic artery. Additional arteriography was performed. The right branch of the middle colic artery arises from the replaced right hepatic artery. Attention was next turned to the percutaneous transhepatic cholecystostomy tube. The cholecystostomy tube was transected and gently removed over a 0.035 wire. Leaving just the wire in place, additional arteriography was performed in multiple obliquities. No evidence of arterial injury of any of the peripheral right hepatic artery branches traveling in proximity to the percutaneous cholecystostomy tube. However, there is marked hyperemia along the hepatic surface of the gallbladder. This is the most prominent finding on the angiogram. At this time, further evaluation via super selective catheterization utilizing a microcatheter was felt to be necessary.  Therefore, a renegade STC microcatheter was advanced over a Fathom 16 wire and used to select the segment 5 hepatic artery. Arteriography was performed in multiple obliquities. The peripheral branches of the segment 5 hepatic artery pass in close approximation with the course of the transhepatic wire. However, the arteries appear in tire Lea normal with no evidence of spasm, irregularity or pseudoaneurysm. No active bleeding is identified. Given the significant hyperemia along the hepatic surface of the gallbladder, the decision was made to proceed with further evaluation. The microcatheter was successfully advanced into the cystic artery and additional arteriography was performed. No evidence of active bleeding. There is hyperemia along the hepatic surface of the gallbladder as previously noted. Given the patient's persistent hemobilia and the high risk of surgical cholecystectomy, the decision was  made to proceed with proximal embolization of the cystic artery in an effort to decrease arterial perfusion to the region of hyperemia in the hopes that this would decrease further bleeding. Coil embolization was performed using two 2x6 fibered interlock detachable coils. Follow-up arteriography demonstrates successful decreased flow in the cystic artery and significantly decreased hyperemia at the hepatic to gallbladder interface. Again, no active bleeding identified on the final images. Attention was returned to the percutaneous biliary drainage catheter. A new Cook 10 [place] all-purpose biliary drain was advanced over the wire and into the gallbladder lumen a gentle hand injection of contrast material opacifies the gallbladder lumen which is significantly distended by thrombus. The catheter was gently flushed, connected to bag drainage and secured to the skin with 0 Prolene suture. The catheter and sheath were removed and hemostasis attained with the assistance of a Celt closure device. IMPRESSION: 1. No evidence of  arterial injury related to the percutaneous transhepatic cholecystostomy tube. 2. There is marked hyperemia along the hepatic surface of the gallbladder. This suggests that bleeding may be coming from an irritated and friable gallbladder wall. 3. Therefore, proximal coil embolization of the cystic artery was performed. On final angiography the significantly decreased the degree of hyperemia along the hepatic surface of the gallbladder. 4. Successful exchange of 10 French percutaneous transhepatic cholecystostomy tube. PLAN: 1. Continue to trend H and H and transfuse as needed. 2. Follow cholecystostomy tube output. If hemobilia begins to resolve, then we will know the procedure was successful. If hemobilia persists in additional transfusions are required, patient may ultimately require cholecystectomy. Signed, Criselda Peaches, MD, [place] [facility] Radiology Electronically Signed   By: Jacqulynn Cadet M.D.   On: 07/31/2020 09:26   IR Perc Cholecystostomy  Result Date: 07/22/2020 INDICATION: 55 year old male with acute cholecystitis EXAM: CHOLECYSTOSTOMY; IR ULTRASOUND GUIDANCE MEDICATIONS: None ANESTHESIA/SEDATION: Moderate (conscious) sedation was employed during this procedure. A total of Versed 1.0 mg and Fentanyl 50 mcg was administered intravenously. Moderate Sedation Time: 10 minutes. The patient's level of consciousness and vital signs were monitored continuously by radiology nursing throughout the procedure under my direct supervision. FLUOROSCOPY TIME:  Fluoroscopy Time: 0 minutes 24 seconds (3 mGy). COMPLICATIONS: None PROCEDURE: Informed written consent was obtained from the patient and the patient's family after a thorough discussion of the procedural risks, benefits and alternatives. All questions were addressed. Maximal Sterile Barrier Technique was utilized including caps, mask, sterile gowns, sterile gloves, sterile drape, hand hygiene and  skin antiseptic. A timeout was performed prior to the initiation of the procedure. Ultrasound survey of the right upper quadrant was performed for planning purposes. Once the patient is prepped and draped in the usual sterile fashion, the skin and subcutaneous tissues overlying the gallbladder were generously infiltrated 1% lidocaine for local anesthesia. A coaxial needle was advanced under ultrasound guidance through the skin subcutaneous tissues and a small segment of liver into the gallbladder lumen. With removal of the stylet, spontaneous dark bile drainage occurred. Using modified Seldinger technique, a 10 French drain was placed into the gallbladder fossa, with aspiration of the sample for the lab. Contrast injection confirmed position of the tube within the gallbladder lumen. Drainage catheter was attached to gravity drain with a suture retention placed. Patient tolerated the procedure well and remained hemodynamically stable throughout. No complications were encountered and no significant blood loss encountered. IMPRESSION: Status post percutaneous cholecystostomy Signed, Dulcy Fanny. Dellia Nims, [place] [facility] Radiology Electronically Signed  By: Corrie Mckusick D.O.   On: 07/22/2020 17:21   IR US Guide Vasc Access Right  Result Date: 07/31/2020 INDICATION: 55 year old male with a history of acute cholecystitis and gallstone pancreatitis. He underwent placement of a percutaneous cholecystostomy tube on 07/22/2020. Since that time, he has had persistent hemobilia with decreasing hemoglobin requiring transfusion. He presents today for arteriogram and biliary tube exchange in an effort to identify a treatable source of bleeding. EXAM: IR EMBO ART VEN HEMORR LYMPH EXTRAV INC GUIDE ROADMAPPING; IR EXCHANGE BILARY DRAIN; IR ULTRASOUND GUIDANCE VASC ACCESS RIGHT; ADDITIONAL ARTERIOGRAPHY MEDICATIONS: None. ANESTHESIA/SEDATION: Moderate (conscious) sedation was  employed during this procedure. A total of Versed 1.5 mg and Fentanyl 75 mcg was administered intravenously. Moderate Sedation Time: 49 minutes. The patient's level of consciousness and vital signs were monitored continuously by radiology nursing throughout the procedure under my direct supervision. CONTRAST:  26m OMNIPAQUE IOHEXOL 300 MG/ML SOLN, 343mOMNIPAQUE IOHEXOL 300 MG/ML SOLN FLUOROSCOPY TIME:  Fluoroscopy Time: 5 minutes 18 seconds (747 mGy). COMPLICATIONS: None immediate. PROCEDURE: Informed consent was obtained from the patient following explanation of the procedure, risks, benefits and alternatives. The patient understands, agrees and consents for the procedure. All questions were addressed. A time out was performed prior to the initiation of the procedure. Maximal barrier sterile technique utilized including caps, mask, sterile gowns, sterile gloves, large sterile drape, hand hygiene, and Betadine prep. The right common femoral artery was interrogated with ultrasound and found to be widely patent. An image was obtained and stored for the medical record. Local anesthesia was attained by infiltration with 1% lidocaine. A small dermatotomy was made. Under real-time sonographic guidance, the vessel was punctured with a 21 gauge micropuncture needle. Using standard technique, the initial micro needle was exchanged over a 0.018 micro wire for a transitional 4 [place] sheath. The micro sheath was then exchanged over a 0.035 wire for a 5 French vascular sheath. A C2 cobra catheter was used to select the celiac axis. A celiac arteriogram was performed. Variant hepatic arterial anatomy is identified. The celiac axis gives rise to the common hepatic artery which then bifurcates into the left hepatic artery and the gastroduodenal artery. A large left gastric artery is present as well as the splenic artery and the inferior phrenic artery. The C2 cobra catheter was next used to select the superior mesenteric  artery. An arteriogram was performed. There is a replaced right hepatic artery. Using a Glidewire, the C2 cobra catheter was advanced into the right hepatic artery. Additional arteriography was performed. The right branch of the middle colic artery arises from the replaced right hepatic artery. Attention was next turned to the percutaneous transhepatic cholecystostomy tube. The cholecystostomy tube was transected and gently removed over a 0.035 wire. Leaving just the wire in place, additional arteriography was performed in multiple obliquities. No evidence of arterial injury of any of the peripheral right hepatic artery branches traveling in proximity to the percutaneous cholecystostomy tube. However, there is marked hyperemia along the hepatic surface of the gallbladder. This is the most prominent finding on the angiogram. At this time, further evaluation via super selective catheterization utilizing a microcatheter was felt to be necessary. Therefore, a renegade STC microcatheter was advanced over a Fathom 16 wire and used to select the segment 5 hepatic artery. Arteriography was performed in multiple obliquities. The peripheral branches of the segment 5 hepatic artery pass in close approximation with the course of the transhepatic wire. However, the arteries appear in tire Lea normal  with no evidence of spasm, irregularity or pseudoaneurysm. No active bleeding is identified. Given the significant hyperemia along the hepatic surface of the gallbladder, the decision was made to proceed with further evaluation. The microcatheter was successfully advanced into the cystic artery and additional arteriography was performed. No evidence of active bleeding. There is hyperemia along the hepatic surface of the gallbladder as previously noted. Given the patient's persistent hemobilia and the high risk of surgical cholecystectomy, the decision was made to proceed with proximal embolization of the cystic artery in an effort to  decrease arterial perfusion to the region of hyperemia in the hopes that this would decrease further bleeding. Coil embolization was performed using two 2x6 fibered interlock detachable coils. Follow-up arteriography demonstrates successful decreased flow in the cystic artery and significantly decreased hyperemia at the hepatic to gallbladder interface. Again, no active bleeding identified on the final images. Attention was returned to the percutaneous biliary drainage catheter. A new Cook 10 [place] all-purpose biliary drain was advanced over the wire and into the gallbladder lumen a gentle hand injection of contrast material opacifies the gallbladder lumen which is significantly distended by thrombus. The catheter was gently flushed, connected to bag drainage and secured to the skin with 0 Prolene suture. The catheter and sheath were removed and hemostasis attained with the assistance of a Celt closure device. IMPRESSION: 1. No evidence of arterial injury related to the percutaneous transhepatic cholecystostomy tube. 2. There is marked hyperemia along the hepatic surface of the gallbladder. This suggests that bleeding may be coming from an irritated and friable gallbladder wall. 3. Therefore, proximal coil embolization of the cystic artery was performed. On final angiography the significantly decreased the degree of hyperemia along the hepatic surface of the gallbladder. 4. Successful exchange of 10 French percutaneous transhepatic cholecystostomy tube. PLAN: 1. Continue to trend H and H and transfuse as needed. 2. Follow cholecystostomy tube output. If hemobilia begins to resolve, then we will know the procedure was successful. If hemobilia persists in additional transfusions are required, patient may ultimately require cholecystectomy. Signed, Criselda Peaches, MD, [place] [facility] Radiology Electronically Signed   By: Jacqulynn Cadet M.D.   On: 07/31/2020  09:26   IR US Guidance  Result Date: 07/22/2020 INDICATION: 55 year old male with acute cholecystitis EXAM: CHOLECYSTOSTOMY; IR ULTRASOUND GUIDANCE MEDICATIONS: None ANESTHESIA/SEDATION: Moderate (conscious) sedation was employed during this procedure. A total of Versed 1.0 mg and Fentanyl 50 mcg was administered intravenously. Moderate Sedation Time: 10 minutes. The patient's level of consciousness and vital signs were monitored continuously by radiology nursing throughout the procedure under my direct supervision. FLUOROSCOPY TIME:  Fluoroscopy Time: 0 minutes 24 seconds (3 mGy). COMPLICATIONS: None PROCEDURE: Informed written consent was obtained from the patient and the patient's family after a thorough discussion of the procedural risks, benefits and alternatives. All questions were addressed. Maximal Sterile Barrier Technique was utilized including caps, mask, sterile gowns, sterile gloves, sterile drape, hand hygiene and skin antiseptic. A timeout was performed prior to the initiation of the procedure. Ultrasound survey of the right upper quadrant was performed for planning purposes. Once the patient is prepped and draped in the usual sterile fashion, the skin and subcutaneous tissues overlying the gallbladder were generously infiltrated 1% lidocaine for local anesthesia. A coaxial needle was advanced under ultrasound guidance through the skin subcutaneous tissues and a small segment of liver into the gallbladder lumen. With removal of the stylet, spontaneous dark bile drainage occurred. Using modified Seldinger technique, a  88 French drain was placed into the gallbladder fossa, with aspiration of the sample for the lab. Contrast injection confirmed position of the tube within the gallbladder lumen. Drainage catheter was attached to gravity drain with a suture retention placed. Patient tolerated the procedure well and remained hemodynamically stable throughout. No complications were encountered and no  significant blood loss encountered. IMPRESSION: Status post percutaneous cholecystostomy Signed, Dulcy Fanny. Dellia Nims, [facility] Radiology Electronically Signed   By: Corrie Mckusick D.O.   On: 07/22/2020 17:21   DG CHEST PORT 1 VIEW  Result Date: 07/24/2020 CLINICAL DATA:  Chest pain EXAM: PORTABLE CHEST 1 VIEW COMPARISON:  05/26/2020 and prior radiographs FINDINGS: This is a mildly low volume film. Cardiomediastinal silhouette is unchanged. Mild bibasilar atelectasis identified. There is no evidence of focal airspace disease, pulmonary edema, suspicious pulmonary nodule/mass, pleural effusion, or pneumothorax. No acute bony abnormalities are identified. IMPRESSION: Mildly low volume film with mild bibasilar atelectasis. Electronically Signed   By: Margarette Canada M.D.   On: 07/24/2020 09:43   DG Abd Portable 1V  Result Date: 07/24/2020 CLINICAL DATA:  Acute cholecystitis and percutaneous cholecystostomy. EXAM: PORTABLE ABDOMEN - 1 VIEW COMPARISON:  07/21/2020 CT FINDINGS: UPPER limits normal gas-filled small bowel loops within the LEFT abdomen may represent a mild ileus. A RIGHT cholecystostomy tube is present. No other significant abnormalities identified. Gas in the colon and rectum are present. IMPRESSION: Question mild ileus. RIGHT cholecystostomy tube. Electronically Signed   By: Margarette Canada M.D.   On: 07/24/2020 09:45   IR EXCHANGE BILIARY DRAIN  Result Date: 07/31/2020 INDICATION: 55 year old male with a history of acute cholecystitis and gallstone pancreatitis. He underwent placement of a percutaneous cholecystostomy tube on 07/22/2020. Since that time, he has had persistent hemobilia with decreasing hemoglobin requiring transfusion. He presents today for arteriogram and biliary tube exchange in an effort to identify a treatable source of bleeding. EXAM: IR EMBO ART VEN HEMORR LYMPH EXTRAV INC GUIDE ROADMAPPING; IR EXCHANGE BILARY DRAIN; IR  ULTRASOUND GUIDANCE VASC ACCESS RIGHT; ADDITIONAL ARTERIOGRAPHY MEDICATIONS: None. ANESTHESIA/SEDATION: Moderate (conscious) sedation was employed during this procedure. A total of Versed 1.5 mg and Fentanyl 75 mcg was administered intravenously. Moderate Sedation Time: 49 minutes. The patient's level of consciousness and vital signs were monitored continuously by radiology nursing throughout the procedure under my direct supervision. CONTRAST:  43m OMNIPAQUE IOHEXOL 300 MG/ML SOLN, 38mOMNIPAQUE IOHEXOL 300 MG/ML SOLN FLUOROSCOPY TIME:  Fluoroscopy Time: 5 minutes 18 seconds (747 mGy). COMPLICATIONS: None immediate. PROCEDURE: Informed consent was obtained from the patient following explanation of the procedure, risks, benefits and alternatives. The patient understands, agrees and consents for the procedure. All questions were addressed. A time out was performed prior to the initiation of the procedure. Maximal barrier sterile technique utilized including caps, mask, sterile gowns, sterile gloves, large sterile drape, hand hygiene, and Betadine prep. The right common femoral artery was interrogated with ultrasound and found to be widely patent. An image was obtained and stored for the medical record. Local anesthesia was attained by infiltration with 1% lidocaine. A small dermatotomy was made. Under real-time sonographic guidance, the vessel was punctured with a 21 gauge micropuncture needle. Using standard technique, the initial micro needle was exchanged over a 0.018 micro wire for a transitional 4 [place] sheath. The micro sheath was then exchanged over a 0.035 wire for a 5 French vascular sheath. A C2 cobra catheter was used to select the celiac axis. A celiac arteriogram was  performed. Variant hepatic arterial anatomy is identified. The celiac axis gives rise to the common hepatic artery which then bifurcates into the left hepatic artery and the gastroduodenal artery. A large left gastric artery is  present as well as the splenic artery and the inferior phrenic artery. The C2 cobra catheter was next used to select the superior mesenteric artery. An arteriogram was performed. There is a replaced right hepatic artery. Using a Glidewire, the C2 cobra catheter was advanced into the right hepatic artery. Additional arteriography was performed. The right branch of the middle colic artery arises from the replaced right hepatic artery. Attention was next turned to the percutaneous transhepatic cholecystostomy tube. The cholecystostomy tube was transected and gently removed over a 0.035 wire. Leaving just the wire in place, additional arteriography was performed in multiple obliquities. No evidence of arterial injury of any of the peripheral right hepatic artery branches traveling in proximity to the percutaneous cholecystostomy tube. However, there is marked hyperemia along the hepatic surface of the gallbladder. This is the most prominent finding on the angiogram. At this time, further evaluation via super selective catheterization utilizing a microcatheter was felt to be necessary. Therefore, a renegade STC microcatheter was advanced over a Fathom 16 wire and used to select the segment 5 hepatic artery. Arteriography was performed in multiple obliquities. The peripheral branches of the segment 5 hepatic artery pass in close approximation with the course of the transhepatic wire. However, the arteries appear in tire Lea normal with no evidence of spasm, irregularity or pseudoaneurysm. No active bleeding is identified. Given the significant hyperemia along the hepatic surface of the gallbladder, the decision was made to proceed with further evaluation. The microcatheter was successfully advanced into the cystic artery and additional arteriography was performed. No evidence of active bleeding. There is hyperemia along the hepatic surface of the gallbladder as previously noted. Given the patient's persistent hemobilia  and the high risk of surgical cholecystectomy, the decision was made to proceed with proximal embolization of the cystic artery in an effort to decrease arterial perfusion to the region of hyperemia in the hopes that this would decrease further bleeding. Coil embolization was performed using two 2x6 fibered interlock detachable coils. Follow-up arteriography demonstrates successful decreased flow in the cystic artery and significantly decreased hyperemia at the hepatic to gallbladder interface. Again, no active bleeding identified on the final images. Attention was returned to the percutaneous biliary drainage catheter. A new Cook 10 [place] all-purpose biliary drain was advanced over the wire and into the gallbladder lumen a gentle hand injection of contrast material opacifies the gallbladder lumen which is significantly distended by thrombus. The catheter was gently flushed, connected to bag drainage and secured to the skin with 0 Prolene suture. The catheter and sheath were removed and hemostasis attained with the assistance of a Celt closure device. IMPRESSION: 1. No evidence of arterial injury related to the percutaneous transhepatic cholecystostomy tube. 2. There is marked hyperemia along the hepatic surface of the gallbladder. This suggests that bleeding may be coming from an irritated and friable gallbladder wall. 3. Therefore, proximal coil embolization of the cystic artery was performed. On final angiography the significantly decreased the degree of hyperemia along the hepatic surface of the gallbladder. 4. Successful exchange of 10 French percutaneous transhepatic cholecystostomy tube. PLAN: 1. Continue to trend H and H and transfuse as needed. 2. Follow cholecystostomy tube output. If hemobilia begins to resolve, then we will know the procedure was successful. If hemobilia persists in additional transfusions  are required, patient may ultimately require cholecystectomy. Signed, Criselda Peaches, MD,  [place] [facility] Radiology Electronically Signed   By: Jacqulynn Cadet M.D.   On: 07/31/2020 09:26   IR EMBO ART  VEN HEMORR LYMPH EXTRAV  INC GUIDE ROADMAPPING  Result Date: 07/31/2020 INDICATION: 55 year old male with a history of acute cholecystitis and gallstone pancreatitis. He underwent placement of a percutaneous cholecystostomy tube on 07/22/2020. Since that time, he has had persistent hemobilia with decreasing hemoglobin requiring transfusion. He presents today for arteriogram and biliary tube exchange in an effort to identify a treatable source of bleeding. EXAM: IR EMBO ART VEN HEMORR LYMPH EXTRAV INC GUIDE ROADMAPPING; IR EXCHANGE BILARY DRAIN; IR ULTRASOUND GUIDANCE VASC ACCESS RIGHT; ADDITIONAL ARTERIOGRAPHY MEDICATIONS: None. ANESTHESIA/SEDATION: Moderate (conscious) sedation was employed during this procedure. A total of Versed 1.5 mg and Fentanyl 75 mcg was administered intravenously. Moderate Sedation Time: 49 minutes. The patient's level of consciousness and vital signs were monitored continuously by radiology nursing throughout the procedure under my direct supervision. CONTRAST:  45m OMNIPAQUE IOHEXOL 300 MG/ML SOLN, 373mOMNIPAQUE IOHEXOL 300 MG/ML SOLN FLUOROSCOPY TIME:  Fluoroscopy Time: 5 minutes 18 seconds (747 mGy). COMPLICATIONS: None immediate. PROCEDURE: Informed consent was obtained from the patient following explanation of the procedure, risks, benefits and alternatives. The patient understands, agrees and consents for the procedure. All questions were addressed. A time out was performed prior to the initiation of the procedure. Maximal barrier sterile technique utilized including caps, mask, sterile gowns, sterile gloves, large sterile drape, hand hygiene, and Betadine prep. The right common femoral artery was interrogated with ultrasound and found to be widely patent. An image was obtained and stored for the medical record.  Local anesthesia was attained by infiltration with 1% lidocaine. A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

## 2020-08-01 NOTE — Discharge Instructions (Signed)
Consider supplemental protein shakes like premier and/or glucerna   Cholecystostomy, Care After This sheet gives you information about how to care for yourself after your procedure. Your health care provider may also give you more specific instructions. If you have problems or questions, contact your health care provider. What can I expect after the procedure? After your procedure, it is common to have soreness near the incision site of your drainage tube (catheter). Follow these instructions at home: Incision care  Follow instructions from your health care provider about how to take care of your incision site where the catheter was inserted. Make sure you: ? Wash your hands with soap and water before and after you change your bandage (dressing). If soap and water are not available, use hand sanitizer. ? Change your dressing as told by your health care provider.  Check the incision site every day for signs of infection. Check for: ? Redness, swelling, or pain. ? Fluid or blood. ? Warmth. ? Pus or a bad smell.  Do not take baths, swim, or use a hot tub until your health care provider approves. Ask your health care provider if you may take showers. You may only be allowed to take sponge baths.   General instructions  Follow instructions from your health care provider about how to care for your catheter and collection bag at home.  Your health care provider will show you: ? How to record the amount of drainage from the catheter. ? How to flush the catheter. ? How to care for the catheter incision site.  Follow instructions from your health care provider about eating or drinking restrictions.  Take over-the-counter and prescription medicines only as told by your health care provider.  Keep all follow-up visits as told by your health care provider. This is important. Contact a health care provider if:  You have redness, swelling, or pain around the catheter incision site.  You  have nausea or vomiting. Get help right away if:  Your abdominal pain gets worse.  You feel dizzy or you faint while standing.  You have fluid or blood coming from the catheter incision site.  The area around the catheter incision site feels warm to the touch.  You have pus or a bad smell coming from the catheter incision site.  You have a fever.  You have shortness of breath.  You have a rapid heartbeat.  Your nausea or vomiting does not go away.  Your catheter becomes blocked.  Your catheter comes out of your abdomen. Summary  After your procedure, it is common to have soreness near the incision site of your drainage tube (catheter).  Wash your hands with soap and water before and after you change your bandage (dressing). Change your dressing as told by your health care provider.  Check the catheter incision site every day for signs of infection. Check for redness, swelling, pain, fluid, blood, warmth, pus, or a bad smell.  Contact your health care provider if you have nausea or vomiting, or if you have redness, swelling, or pain around your catheter incision site.  Get help right away if your abdominal pain gets worse, you feel dizzy, you have blood or fluid coming from the catheter incision site, you have a fever, or you have shortness of breath. This information is not intended to replace advice given to you by your health care provider. Make sure you discuss any questions you have with your health care provider. Document Revised: 11/12/2017 Document Reviewed: 11/12/2017 Elsevier  Patient Education  2021 [employer]  Acute Pancreatitis  Acute pancreatitis happens when the pancreas gets swollen. The pancreas is a large gland in the body that helps to control blood sugar. It also makes enzymes that help to digest food. This condition can last a few days and cause serious problems. The lungs, heart, and kidneys may stop working. What are the causes? Causes  include:  Alcohol abuse.  Drug abuse.  Gallstones.  A tumor in the pancreas. Other causes include:  Some medicines.  Some chemicals.  Diabetes.  An infection.  Damage caused by an accident.  The poison (venom) from a scorpion bite.  Belly (abdominal) surgery.  The body's defense system (immune system) attacking the pancreas (autoimmune pancreatitis).  Genes that are passed from parent to child (inherited). In some cases, the cause is not known. What are the signs or symptoms?  Pain in the upper belly that may be felt in the back. The pain may be very bad.  Swelling of the belly.  Feeling sick to your stomach (nauseous) and throwing up (vomiting).  Fever. How is this treated? You will likely have to stay in the hospital. Treatment may include:  Pain medicine.  Fluid through an IV tube.  Placing a tube in the stomach to take out the stomach contents. This may help you stop throwing up.  Not eating for 3-4 days.  Antibiotic medicines, if you have an infection.  Treating any other problems that may be the cause.  Steroid medicines, if your problem is caused by your defense system attacking your body's own tissues.  Surgery. Follow these instructions at home: Eating and drinking  Follow instructions from your doctor about what to eat and drink.  Eat foods that do not have a lot of fat in them.  Eat small meals often. Do not eat big meals.  Drink enough fluid to keep your pee (urine) pale yellow.  Do not drink alcohol if it caused your condition.   Medicines  Take over-the-counter and prescription medicines only as told by your doctor.  Ask your doctor if the medicine prescribed to you: ? Requires you to avoid driving or using heavy machinery. ? Can cause trouble pooping (constipation). You may need to take steps to prevent or treat trouble pooping:  Take over-the-counter or prescription medicines.  Eat foods that are high in fiber. These  include beans, whole grains, and fresh fruits and vegetables.  Limit foods that are high in fat and sugar. These include fried or sweet foods. General instructions  Do not use any products that contain nicotine or tobacco, such as cigarettes, e-cigarettes, and chewing tobacco. If you need help quitting, ask your doctor.  Get plenty of rest.  Check your blood sugar at home as told by your doctor.  Keep all follow-up visits as told by your doctor. This is important. Contact a doctor if:  You do not get better as quickly as expected.  You have new symptoms.  Your symptoms get worse.  You have pain or weakness that lasts a long time.  You keep feeling sick to your stomach.  You get better and then you have pain again.  You have a fever. Get help right away if:  You cannot eat or keep fluids down.  Your pain gets very bad.  Your skin or the white part of your eyes turns yellow.  You have sudden swelling in your belly.  You throw up.  You feel dizzy or you pass out (  faint).  Your blood sugar is high (over 300 mg/dL). Summary  Acute pancreatitis happens when the pancreas gets swollen.  This condition is often caused by alcohol abuse, drug abuse, or gallstones.  You will likely have to stay in the hospital for treatment. This information is not intended to replace advice given to you by your health care provider. Make sure you discuss any questions you have with your health care provider. Document Revised: 02/04/2018 Document Reviewed: 02/04/2018 Elsevier Patient Education  2021 Elsevier Inc.  Cholecystitis  Cholecystitis is irritation and swelling (inflammation) of the gallbladder. The gallbladder is an organ that is shaped like a pear. It is under the liver on the right side of the body. This organ stores bile. Bile helps the body break down (digest) the fats in food. This condition can occur all of a sudden. It needs to be treated. What are the causes? This  condition may be caused by stones or lumps that form in the gallbladder (gallstones). Gallstones can block the tube (duct) that carries bile out of your gallbladder. Other causes are:  Damage to the gallbladder due to less blood flow.  Germs in the bile ducts.  Scars or kinks in the bile ducts.  Abnormal growths (tumors) in the liver, pancreas, or gallbladder. What increases the risk? You are more likely to develop this condition if:  You have sickle cell disease.  You take birth control pills.  You use estrogen.  You have alcoholic liver disease.  You have liver cirrhosis.  You are being fed through a vein.  You are very ill.  You do not eat or drink for a long time. This is also called "fasting."  You are overweight (obese).  You lose weight too fast.  You are pregnant.  You have high levels of fat in the blood (triglycerides).  You have irritation and swelling of the pancreas (pancreatitis). What are the signs or symptoms? Symptoms of this condition include:  Pain in the belly (abdomen). Pain is often in the upper right area of the belly.  Tenderness or bloating in the belly.  Feeling sick to your stomach (nauseous).  Throwing up (vomiting).  Fever.  Chills. How is this diagnosed? This condition may be diagnosed with a medical history and exam. You may also have other tests, such as:  Imaging tests. This may include: ? Ultrasound. ? CT scan of the belly. ? Nuclear scan. This is also called a HIDA scan. This scan lets your doctor see the bile as it moves in your body. ? MRI.  Blood tests. These are done to check: ? Your blood count. The white blood cell count may be higher than normal. ? How well your liver works.   How is this treated? This condition may be treated with:  Surgery to take out your gallbladder.  Antibiotic medicines to treat illnesses caused by germs.  Going without food for some time.  Giving fluids through an IV  tube.  Medicines to treat pain or throwing up. Follow these instructions at home:  If you had surgery, follow instructions from your doctor about how to care for yourself after you go home. Medicines  Take over-the-counter and prescription medicines only as told by your doctor.  If you were prescribed an antibiotic medicine, take it as told by your doctor. Do not stop taking it even if you start to feel better.   General instructions  Follow instructions from your doctor about what to eat or drink. Do not  eat or drink anything that makes you sick again.  Do not lift anything that is heavier than 10 lb (4.5 kg) until your doctor says that it is safe.  Do not use any products that contain nicotine or tobacco, such as cigarettes and e-cigarettes. If you need help quitting, ask your doctor.  Keep all follow-up visits as told by your doctor. This is important. Contact a doctor if:  You have pain and your medicine does not help.  You have a fever. Get help right away if:  Your pain moves to: ? Another part of your belly. ? Your back.  Your symptoms do not go away.  You have new symptoms. Summary  Cholecystitis is swelling and irritation of the gallbladder.  This condition may be caused by stones or lumps that form in the gallbladder (gallstones).  Common symptoms are pain in the belly. You may feel sick to your stomach and start throwing up. You may also have a fever and chills.  This condition may be treated with surgery to take out the gallbladder. It may also be treated with medicines, fasting, and fluids through an IV tube.  Follow what you are told about eating and drinking. Do not eat things that make you sick again. This information is not intended to replace advice given to you by your health care provider. Make sure you discuss any questions you have with your health care provider. Document Revised: 08/24/2017 Document Reviewed: 08/24/2017 Elsevier Patient Education   2021 [employer].

## 2020-08-01 NOTE — Final Consult Note (Signed)
Consultant Final Sign-Off Note    Assessment/Final recommendations  Billy Cowan is a 55 y.o. male followed by me for gallstone pancreatitis, possible cholecystitis s/p perc cholecystostomy tube  ABL anemia - stable  outpt f/u with IR for outpt cholangiogram/drain injection Flush drain daily  Wound care (if applicable):    Diet at discharge: low fat   Activity at discharge: ad lib   Follow-up appointment:  Dr Janee Morn - on AVS   Pending results:  Unresulted Labs (From admission, onward)          Start     Ordered   07/29/20 0500  Comprehensive metabolic panel  Daily,   R      07/28/20 1820   07/22/20 0500  CBC with Differential/Platelet  Daily,   R      07/21/20 1836   07/22/20 0500  Magnesium  Daily,   R     Question:  Specimen collection method  Answer:  Unit=Unit collect   07/21/20 1836           Medication recommendations: Consider iron supplement given anemia Pt advised about constipation with iron supplement Consider supplemental protein shakes like premier and/or glucerna   Other recommendations: rediscussed typical pathway to surgery for cholecystectomy   Thank you for allowing [place] to participate in the care of your patient!  Please consult [place] again if you have further needs for your patient.  Gaynelle Adu 08/01/2020 10:57 AM    Subjective   No c/o No pain hgb stable  Objective  Vital signs in last 24 hours: Temp:  [98.1 F (36.7 C)-98.8 F (37.1 C)] 98.1 F (36.7 C) (04/03 0942) Pulse Rate:  [95-109] 109 (04/03 0942) Resp:  [15-18] 18 (04/03 0942) BP: (106-132)/(77-83) 106/77 (04/03 0942) SpO2:  [97 %-99 %] 99 % (04/03 0942)  General: Alert, nontoxic Soft, nt, nd Old blood in drain   Pertinent labs and Studies: Recent Labs    07/30/20 0336 07/30/20 2014 07/31/20 0227 08/01/20 0347  WBC 24.9*  --  18.6* 17.3*  HGB 7.1* 9.1* 8.7* 8.8*  HCT 21.8* 28.1* 27.1* 27.7*   BMET Recent Labs    07/31/20 0227 08/01/20 0347  NA 132*  134*  K 3.7 3.9  CL 100 103  CO2 23 22  GLUCOSE 194* 205*  BUN <5* 5*  CREATININE 0.59* 0.60*  CALCIUM 8.9 8.9   No results for input(s): LABURIN in the last 72 hours. Results for orders placed or performed during the hospital encounter of 07/19/20  Blood culture (routine x 2)     Status: None   Collection Time: 07/19/20  5:38 AM   Specimen: BLOOD  Result Value Ref Range Status   Specimen Description BLOOD SITE NOT SPECIFIED  Final   Special Requests   Final    BOTTLES DRAWN AEROBIC AND ANAEROBIC Blood Culture results may not be optimal due to an inadequate volume of blood received in culture bottles   Culture   Final    NO GROWTH 5 DAYS Performed at [facility], [address] [place], [place] [address]    Report Status 07/24/2020 FINAL  Final  Blood culture (routine x 2)     Status: None   Collection Time: 07/19/20  5:46 AM   Specimen: BLOOD  Result Value Ref Range Status   Specimen Description BLOOD SITE NOT SPECIFIED  Final   Special Requests   Final    BOTTLES DRAWN AEROBIC AND ANAEROBIC Blood Culture results may not be optimal  due to an inadequate volume of blood received in culture bottles   Culture   Final    NO GROWTH 5 DAYS Performed at [facility], [address] [place], [place] [address]    Report Status 07/24/2020 FINAL  Final  Resp Panel by RT-PCR (Flu A&B, Covid) Nasopharyngeal Swab     Status: None   Collection Time: 07/19/20  5:52 AM   Specimen: Nasopharyngeal Swab; Nasopharyngeal(NP) swabs in vial transport medium  Result Value Ref Range Status   SARS Coronavirus 2 by RT PCR NEGATIVE NEGATIVE Final    Comment: (NOTE) SARS-CoV-2 target nucleic acids are NOT DETECTED.  The SARS-CoV-2 RNA is generally detectable in upper respiratory specimens during the acute phase of infection. The lowest concentration of SARS-CoV-2 viral copies this assay can detect is 138 copies/mL. A negative result does not preclude SARS-Cov-2 infection and should  not be used as the sole basis for treatment or other patient management decisions. A negative result may occur with  improper specimen collection/handling, submission of specimen other than nasopharyngeal swab, presence of viral mutation(s) within the areas targeted by this assay, and inadequate number of viral copies(<138 copies/mL). A negative result must be combined with clinical observations, patient history, and epidemiological information. The expected result is Negative.  Fact Sheet for Patients:  BloggerCourse.com  Fact Sheet for Healthcare Providers:  SeriousBroker.it  This test is no t yet approved or cleared by the [place] FDA and  has been authorized for detection and/or diagnosis of SARS-CoV-2 by FDA under an Emergency Use Authorization (EUA). This EUA will remain  in effect (meaning this test can be used) for the duration of the COVID-19 declaration under Section 564(b)(1) of the Act, 21 U.S.C.section 360bbb-3(b)(1), unless the authorization is terminated  or revoked sooner.       Influenza A by PCR NEGATIVE NEGATIVE Final   Influenza B by PCR NEGATIVE NEGATIVE Final    Comment: (NOTE) The Xpert Xpress SARS-CoV-2/FLU/RSV plus assay is intended as an aid in the diagnosis of influenza from Nasopharyngeal swab specimens and should not be used as a sole basis for treatment. Nasal washings and aspirates are unacceptable for Xpert Xpress SARS-CoV-2/FLU/RSV testing.  Fact Sheet for Patients: BloggerCourse.com  Fact Sheet for Healthcare Providers: SeriousBroker.it  This test is not yet approved or cleared by the [place] FDA and has been authorized for detection and/or diagnosis of SARS-CoV-2 by FDA under an Emergency Use Authorization (EUA). This EUA will remain in effect (meaning this test can be used) for the duration of the COVID-19 declaration under  Section 564(b)(1) of the Act, 21 U.S.C. section 360bbb-3(b)(1), unless the authorization is terminated or revoked.  Performed at [facility], [address] [place], [place] [address]   Culture, Urine     Status: None   Collection Time: 07/19/20  5:46 PM   Specimen: Urine, Random  Result Value Ref Range Status   Specimen Description URINE, RANDOM  Final   Special Requests NONE  Final   Culture   Final    NO GROWTH Performed at [facility], [address] [place], [place] [address]    Report Status 07/20/2020 FINAL  Final  Aerobic/Anaerobic Culture w Gram Stain (surgical/deep wound)     Status: None   Collection Time: 07/22/20  4:40 PM   Specimen: PATH Gallbladder; Tissue  Result Value Ref Range Status   Specimen Description FLUID GALLBLADDER  Final   Special Requests NONE  Final  Gram Stain   Final    MODERATE WBC PRESENT,BOTH PMN AND MONONUCLEAR NO ORGANISMS SEEN    Culture   Final    No growth aerobically or anaerobically. Performed at [facility], [address] [place], [place] [address]    Report Status 07/27/2020 FINAL  Final    Imaging: No results found.

## 2020-08-02 ENCOUNTER — Other Ambulatory Visit (HOSPITAL_COMMUNITY): Payer: Self-pay

## 2020-08-02 MED ORDER — SODIUM CHLORIDE FLUSH 0.9 % IV SOLN
10.0000 mL | INTRAVENOUS | 1 refills | Status: DC
  Filled 2020-08-02: qty 250, 12d supply, fill #0

## 2020-08-03 ENCOUNTER — Other Ambulatory Visit (HOSPITAL_COMMUNITY): Payer: Self-pay

## 2020-08-05 ENCOUNTER — Other Ambulatory Visit: Payer: Self-pay | Admitting: General Surgery

## 2020-08-05 DIAGNOSIS — Z4682 Encounter for fitting and adjustment of non-vascular catheter: Secondary | ICD-10-CM

## 2020-08-13 ENCOUNTER — Emergency Department (HOSPITAL_COMMUNITY): Payer: 59

## 2020-08-13 ENCOUNTER — Inpatient Hospital Stay (HOSPITAL_COMMUNITY)
Admission: EM | Admit: 2020-08-13 | Discharge: 2020-08-15 | DRG: 439 | Disposition: A | Discharge status: Home / Self Care | Payer: 59 | Attending: Internal Medicine | Admitting: Internal Medicine

## 2020-08-13 ENCOUNTER — Telehealth: Payer: Self-pay | Admitting: Diagnostic Radiology

## 2020-08-13 DIAGNOSIS — R1013 Epigastric pain: Secondary | ICD-10-CM

## 2020-08-13 DIAGNOSIS — E119 Type 2 diabetes mellitus without complications: Secondary | ICD-10-CM | POA: Diagnosis not present

## 2020-08-13 DIAGNOSIS — G8929 Other chronic pain: Secondary | ICD-10-CM

## 2020-08-13 DIAGNOSIS — Z9081 Acquired absence of spleen: Secondary | ICD-10-CM

## 2020-08-13 DIAGNOSIS — Z8616 Personal history of COVID-19: Secondary | ICD-10-CM

## 2020-08-13 DIAGNOSIS — Z7982 Long term (current) use of aspirin: Secondary | ICD-10-CM

## 2020-08-13 DIAGNOSIS — E785 Hyperlipidemia, unspecified: Secondary | ICD-10-CM | POA: Diagnosis present

## 2020-08-13 DIAGNOSIS — Z833 Family history of diabetes mellitus: Secondary | ICD-10-CM

## 2020-08-13 DIAGNOSIS — K831 Obstruction of bile duct: Secondary | ICD-10-CM | POA: Diagnosis not present

## 2020-08-13 DIAGNOSIS — Z20822 Contact with and (suspected) exposure to covid-19: Secondary | ICD-10-CM | POA: Diagnosis present

## 2020-08-13 DIAGNOSIS — B3781 Candidal esophagitis: Secondary | ICD-10-CM | POA: Diagnosis present

## 2020-08-13 DIAGNOSIS — Z8261 Family history of arthritis: Secondary | ICD-10-CM

## 2020-08-13 DIAGNOSIS — Z8249 Family history of ischemic heart disease and other diseases of the circulatory system: Secondary | ICD-10-CM

## 2020-08-13 DIAGNOSIS — M109 Gout, unspecified: Secondary | ICD-10-CM | POA: Diagnosis present

## 2020-08-13 DIAGNOSIS — R5381 Other malaise: Secondary | ICD-10-CM | POA: Diagnosis not present

## 2020-08-13 DIAGNOSIS — E781 Pure hyperglyceridemia: Secondary | ICD-10-CM | POA: Diagnosis present

## 2020-08-13 DIAGNOSIS — K8051 Calculus of bile duct without cholangitis or cholecystitis with obstruction: Secondary | ICD-10-CM | POA: Diagnosis present

## 2020-08-13 DIAGNOSIS — Z79899 Other long term (current) drug therapy: Secondary | ICD-10-CM

## 2020-08-13 DIAGNOSIS — K851 Biliary acute pancreatitis without necrosis or infection: Secondary | ICD-10-CM | POA: Diagnosis not present

## 2020-08-13 LAB — URINALYSIS, ROUTINE W REFLEX MICROSCOPIC
Bacteria, UA: NONE SEEN
Bilirubin Urine: NEGATIVE
Glucose, UA: >=500 mg/dL — AB
Hgb urine dipstick: NEGATIVE
Ketones, ur: NEGATIVE mg/dL
Leukocytes,Ua: NEGATIVE
Nitrite: NEGATIVE
Protein, ur: NEGATIVE mg/dL
Specific Gravity, Urine: 1.031 — ABNORMAL HIGH (ref 1.005–1.030)
pH: 5 (ref 5.0–8.0)

## 2020-08-13 LAB — COMPREHENSIVE METABOLIC PANEL
ALT: 63 U/L — ABNORMAL HIGH (ref 0–44)
AST: 64 U/L — ABNORMAL HIGH (ref 15–41)
Albumin: 4.1 g/dL (ref 3.5–5.0)
Alkaline Phosphatase: 131 U/L — ABNORMAL HIGH (ref 38–126)
Anion gap: 12 (ref 5–15)
BUN: 12 mg/dL (ref 6–20)
CO2: 20 mmol/L — ABNORMAL LOW (ref 22–32)
Calcium: 10.8 mg/dL — ABNORMAL HIGH (ref 8.9–10.3)
Chloride: 99 mmol/L (ref 98–111)
Creatinine, Ser: 0.79 mg/dL (ref 0.61–1.24)
GFR, Estimated: >60 mL/min (ref >60)
Glucose, Bld: 349 mg/dL — ABNORMAL HIGH (ref 70–99)
Potassium: 4.5 mmol/L (ref 3.5–5.1)
Sodium: 131 mmol/L — ABNORMAL LOW (ref 135–145)
Total Bilirubin: 2 mg/dL — ABNORMAL HIGH (ref 0.3–1.2)
Total Protein: 9.5 g/dL — ABNORMAL HIGH (ref 6.5–8.1)

## 2020-08-13 LAB — TYPE AND SCREEN
ABO/RH(D): O POS
Antibody Screen: NEGATIVE

## 2020-08-13 LAB — CBC WITH DIFFERENTIAL/PLATELET
Abs Immature Granulocytes: 0.11 10*3/uL — ABNORMAL HIGH (ref 0.00–0.07)
Basophils Absolute: 0.1 10*3/uL (ref 0.0–0.1)
Basophils Relative: 1 %
Eosinophils Absolute: 0 10*3/uL (ref 0.0–0.5)
Eosinophils Relative: 0 %
HCT: 44.5 % (ref 39.0–52.0)
Hemoglobin: 13.8 g/dL (ref 13.0–17.0)
Immature Granulocytes: 1 %
Lymphocytes Relative: 14 %
Lymphs Abs: 1.8 10*3/uL (ref 0.7–4.0)
MCH: 29.4 pg (ref 26.0–34.0)
MCHC: 31 g/dL (ref 30.0–36.0)
MCV: 94.9 fL (ref 80.0–100.0)
Monocytes Absolute: 0.3 10*3/uL (ref 0.1–1.0)
Monocytes Relative: 3 %
Neutro Abs: 10.3 10*3/uL — ABNORMAL HIGH (ref 1.7–7.7)
Neutrophils Relative %: 81 %
Platelets: 754 10*3/uL — ABNORMAL HIGH (ref 150–400)
RBC: 4.69 MIL/uL (ref 4.22–5.81)
RDW: 14.6 % (ref 11.5–15.5)
WBC: 12.5 10*3/uL — ABNORMAL HIGH (ref 4.0–10.5)
nRBC: 0 % (ref 0.0–0.2)

## 2020-08-13 LAB — GLUCOSE, CAPILLARY: Glucose-Capillary: 142 mg/dL — ABNORMAL HIGH (ref 70–99)

## 2020-08-13 LAB — LIPASE, BLOOD: Lipase: 912 U/L — ABNORMAL HIGH (ref 11–51)

## 2020-08-13 MED ORDER — PREDNISONE 5 MG PO TABS
8.0000 mg | ORAL_TABLET | Freq: Every day | ORAL | Status: DC
  Administered 2020-08-14 – 2020-08-15 (×2): 8 mg via ORAL
  Filled 2020-08-13 (×2): qty 3

## 2020-08-13 MED ORDER — DOCUSATE SODIUM 100 MG PO CAPS
100.0000 mg | ORAL_CAPSULE | Freq: Two times a day (BID) | ORAL | Status: DC | PRN
  Administered 2020-08-14 – 2020-08-15 (×2): 100 mg via ORAL
  Filled 2020-08-13 (×2): qty 1

## 2020-08-13 MED ORDER — INSULIN ASPART 100 UNIT/ML ~~LOC~~ SOLN: 0.0000 [IU] | SUBCUTANEOUS | Status: DC

## 2020-08-13 MED ORDER — PREDNISONE 5 MG PO TABS: 6.0000 mg | ORAL_TABLET | Freq: Every day | ORAL | Status: DC

## 2020-08-13 MED ORDER — POLYETHYLENE GLYCOL 3350 17 G PO PACK
17.0000 g | PACK | Freq: Every day | ORAL | Status: DC | PRN
  Administered 2020-08-14: 17 g via ORAL
  Filled 2020-08-13: qty 1

## 2020-08-13 MED ORDER — IOHEXOL 300 MG/ML  SOLN
100.0000 mL | Freq: Once | INTRAMUSCULAR | Status: AC | PRN
  Administered 2020-08-13: 100 mL via INTRAVENOUS

## 2020-08-13 MED ORDER — PREDNISONE 1 MG PO TABS: 1.0000 mg | ORAL_TABLET | ORAL | Status: DC

## 2020-08-13 MED ORDER — FENOFIBRATE 160 MG PO TABS
160.0000 mg | ORAL_TABLET | Freq: Every day | ORAL | Status: DC
  Administered 2020-08-15: 160 mg via ORAL
  Filled 2020-08-13: qty 1

## 2020-08-13 MED ORDER — PREDNISONE 5 MG PO TABS: 7.0000 mg | ORAL_TABLET | Freq: Every day | ORAL | Status: DC

## 2020-08-13 MED ORDER — ACETAMINOPHEN 650 MG RE SUPP: 650.0000 mg | Freq: Four times a day (QID) | RECTAL | Status: DC | PRN

## 2020-08-13 MED ORDER — ASPIRIN EC 81 MG PO TBEC
81.0000 mg | DELAYED_RELEASE_TABLET | Freq: Every day | ORAL | Status: DC
  Administered 2020-08-15: 81 mg via ORAL
  Filled 2020-08-13 (×2): qty 1

## 2020-08-13 MED ORDER — ALLOPURINOL 100 MG PO TABS
100.0000 mg | ORAL_TABLET | Freq: Every day | ORAL | Status: DC
  Administered 2020-08-14 – 2020-08-15 (×2): 100 mg via ORAL
  Filled 2020-08-13 (×2): qty 1

## 2020-08-13 MED ORDER — ONDANSETRON HCL 4 MG/2ML IJ SOLN: 4.0000 mg | Freq: Four times a day (QID) | INTRAMUSCULAR | Status: DC | PRN

## 2020-08-13 MED ORDER — LACTATED RINGERS IV BOLUS
2000.0000 mL | Freq: Once | INTRAVENOUS | Status: AC
  Administered 2020-08-13: 2000 mL via INTRAVENOUS

## 2020-08-13 MED ORDER — ONDANSETRON HCL 4 MG PO TABS
4.0000 mg | ORAL_TABLET | Freq: Four times a day (QID) | ORAL | Status: DC | PRN
Start: 2020-08-13 — End: 2020-08-15

## 2020-08-13 MED ORDER — ACETAMINOPHEN 325 MG PO TABS: 650.0000 mg | ORAL_TABLET | Freq: Four times a day (QID) | ORAL | Status: DC | PRN

## 2020-08-13 MED ORDER — LACTATED RINGERS IV SOLN: INTRAVENOUS | Status: DC

## 2020-08-13 MED ORDER — FERROUS SULFATE 325 (65 FE) MG PO TABS
325.0000 mg | ORAL_TABLET | Freq: Every day | ORAL | Status: DC
  Administered 2020-08-14 – 2020-08-15 (×2): 325 mg via ORAL
  Filled 2020-08-13 (×2): qty 1

## 2020-08-13 MED ORDER — ENOXAPARIN SODIUM 40 MG/0.4ML ~~LOC~~ SOLN: 40.0000 mg | SUBCUTANEOUS | Status: DC

## 2020-08-13 MED ORDER — MORPHINE SULFATE (PF) 2 MG/ML IV SOLN
2.0000 mg | INTRAVENOUS | Status: DC | PRN
  Administered 2020-08-13 – 2020-08-14 (×3): 2 mg via INTRAVENOUS
  Filled 2020-08-13 (×3): qty 1

## 2020-08-13 NOTE — ED Triage Notes (Signed)
Pt here with reports of bloody fluid collecting in drain rapidly onset today. States discharged 2 weeks ago with this drain. Pt states today he is lethargic. HR 140's in triage.
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
## 2020-08-13 NOTE — Telephone Encounter (Signed)
Patient called IR clinic about significantly increased output from cholecystostomy tube and lethargy.  He has emptied the bag 4 times already today and this is a significant change.  The output is slightly bloody but unchanged.  Patient has history of gallstone pancreatitis and underwent a percutaneous cholecystostomy on 07/22/20.  Patient had blood loss after the tube placement and angiogram was negative for active bleeding but the cystic artery was empirically embolized.  Patient says that his hemoglobin was recently checked by his primary care physician and it had improved from his hospitalization.  Explained to patient that I do not know why the cholecystostomy tube output has increased but I am concerned that he is bleeding again and we need to check labs.  I recommend that patient goes to the emergency department for evaluation and labs.  If he has evidence of bleeding/blood loss, will need to consult surgery about potential cholecystectomy.  Patient understands the situation and he will go to the emergency department today.

## 2020-08-13 NOTE — ED Notes (Signed)
600CC dark drainage removed out of of R sided biliary tube drain.

## 2020-08-13 NOTE — ED Provider Notes (Signed)
Patient placed in Quick Look pathway, seen and evaluated   Chief Complaint: increased drainage from gallbladder   HPI:   Pt reports he feels bad and has emptied 4 bags from drain.   ROS: no fever, no abdominal pain Physical Exam:   Gen: No distress  Neuro: Awake and Alert  Skin: Warm    Focused Exam: tachycardic to 146, abdomen soft  Drain right upper abdomen  Large amunt of drainage    Initiation of care has begun. The patient has been counseled on the process, plan, and necessity for staying for the completion/evaluation, and the remainder of the medical screening examination   Billy Cowan 08/13/20 1605    Koleen Distance, MD 08/13/20 [phone]

## 2020-08-13 NOTE — ED Notes (Signed)
Patient transported to CT 
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
## 2020-08-13 NOTE — ED Notes (Signed)
Attempted report 

## 2020-08-14 DIAGNOSIS — Z8249 Family history of ischemic heart disease and other diseases of the circulatory system: Secondary | ICD-10-CM | POA: Diagnosis not present

## 2020-08-14 DIAGNOSIS — E785 Hyperlipidemia, unspecified: Secondary | ICD-10-CM | POA: Diagnosis present

## 2020-08-14 DIAGNOSIS — Z8261 Family history of arthritis: Secondary | ICD-10-CM | POA: Diagnosis not present

## 2020-08-14 DIAGNOSIS — Z7982 Long term (current) use of aspirin: Secondary | ICD-10-CM | POA: Diagnosis not present

## 2020-08-14 DIAGNOSIS — E119 Type 2 diabetes mellitus without complications: Secondary | ICD-10-CM

## 2020-08-14 DIAGNOSIS — G8929 Other chronic pain: Secondary | ICD-10-CM | POA: Diagnosis present

## 2020-08-14 DIAGNOSIS — E781 Pure hyperglyceridemia: Secondary | ICD-10-CM | POA: Diagnosis present

## 2020-08-14 DIAGNOSIS — Z20822 Contact with and (suspected) exposure to covid-19: Secondary | ICD-10-CM | POA: Diagnosis present

## 2020-08-14 DIAGNOSIS — K851 Biliary acute pancreatitis without necrosis or infection: Secondary | ICD-10-CM | POA: Diagnosis present

## 2020-08-14 DIAGNOSIS — K831 Obstruction of bile duct: Secondary | ICD-10-CM | POA: Diagnosis not present

## 2020-08-14 DIAGNOSIS — R5381 Other malaise: Secondary | ICD-10-CM | POA: Diagnosis present

## 2020-08-14 DIAGNOSIS — Z79899 Other long term (current) drug therapy: Secondary | ICD-10-CM | POA: Diagnosis not present

## 2020-08-14 DIAGNOSIS — Z9081 Acquired absence of spleen: Secondary | ICD-10-CM | POA: Diagnosis not present

## 2020-08-14 DIAGNOSIS — M109 Gout, unspecified: Secondary | ICD-10-CM | POA: Diagnosis present

## 2020-08-14 DIAGNOSIS — Z8616 Personal history of COVID-19: Secondary | ICD-10-CM | POA: Diagnosis not present

## 2020-08-14 DIAGNOSIS — K8051 Calculus of bile duct without cholangitis or cholecystitis with obstruction: Secondary | ICD-10-CM | POA: Diagnosis present

## 2020-08-14 DIAGNOSIS — Z833 Family history of diabetes mellitus: Secondary | ICD-10-CM | POA: Diagnosis not present

## 2020-08-14 DIAGNOSIS — B3781 Candidal esophagitis: Secondary | ICD-10-CM | POA: Diagnosis present

## 2020-08-14 DIAGNOSIS — M545 Low back pain, unspecified: Secondary | ICD-10-CM | POA: Diagnosis not present

## 2020-08-14 LAB — CBC
HCT: 36 % — ABNORMAL LOW (ref 39.0–52.0)
Hemoglobin: 11.2 g/dL — ABNORMAL LOW (ref 13.0–17.0)
MCH: 29 pg (ref 26.0–34.0)
MCHC: 31.1 g/dL (ref 30.0–36.0)
MCV: 93.3 fL (ref 80.0–100.0)
Platelets: 598 10*3/uL — ABNORMAL HIGH (ref 150–400)
RBC: 3.86 MIL/uL — ABNORMAL LOW (ref 4.22–5.81)
RDW: 14.5 % (ref 11.5–15.5)
WBC: 12.5 10*3/uL — ABNORMAL HIGH (ref 4.0–10.5)
nRBC: 0 % (ref 0.0–0.2)

## 2020-08-14 LAB — COMPREHENSIVE METABOLIC PANEL
ALT: 44 U/L (ref 0–44)
AST: 31 U/L (ref 15–41)
Albumin: 3.2 g/dL — ABNORMAL LOW (ref 3.5–5.0)
Alkaline Phosphatase: 94 U/L (ref 38–126)
Anion gap: 9 (ref 5–15)
BUN: 7 mg/dL (ref 6–20)
CO2: 19 mmol/L — ABNORMAL LOW (ref 22–32)
Calcium: 10.2 mg/dL (ref 8.9–10.3)
Chloride: 105 mmol/L (ref 98–111)
Creatinine, Ser: 0.64 mg/dL (ref 0.61–1.24)
GFR, Estimated: >60 mL/min (ref >60)
Glucose, Bld: 138 mg/dL — ABNORMAL HIGH (ref 70–99)
Potassium: 4.5 mmol/L (ref 3.5–5.1)
Sodium: 133 mmol/L — ABNORMAL LOW (ref 135–145)
Total Bilirubin: 1.5 mg/dL — ABNORMAL HIGH (ref 0.3–1.2)
Total Protein: 7.4 g/dL (ref 6.5–8.1)

## 2020-08-14 LAB — GLUCOSE, CAPILLARY
Glucose-Capillary: 119 mg/dL — ABNORMAL HIGH (ref 70–99)
Glucose-Capillary: 114 mg/dL — ABNORMAL HIGH (ref 70–99)
Glucose-Capillary: 216 mg/dL — ABNORMAL HIGH (ref 70–99)
Glucose-Capillary: 174 mg/dL — ABNORMAL HIGH (ref 70–99)

## 2020-08-14 LAB — MRSA PCR SCREENING: MRSA by PCR: NEGATIVE

## 2020-08-14 LAB — SARS CORONAVIRUS 2 (TAT 6-24 HRS): SARS Coronavirus 2: NEGATIVE

## 2020-08-14 MED ORDER — INSULIN ASPART 100 UNIT/ML ~~LOC~~ SOLN
0.0000 [IU] | Freq: Three times a day (TID) | SUBCUTANEOUS | Status: DC
  Administered 2020-08-14: 2 [IU] via SUBCUTANEOUS
  Administered 2020-08-14: 3 [IU] via SUBCUTANEOUS
  Administered 2020-08-15: 1 [IU] via SUBCUTANEOUS
  Administered 2020-08-15: 5 [IU] via SUBCUTANEOUS

## 2020-08-14 MED ORDER — OXYCODONE HCL 5 MG PO TABS
5.0000 mg | ORAL_TABLET | Freq: Four times a day (QID) | ORAL | Status: DC | PRN
  Administered 2020-08-14 – 2020-08-15 (×4): 5 mg via ORAL
  Filled 2020-08-14 (×4): qty 1

## 2020-08-14 MED ORDER — OXYCODONE-ACETAMINOPHEN 5-325 MG PO TABS
1.0000 | ORAL_TABLET | Freq: Four times a day (QID) | ORAL | Status: DC | PRN
  Administered 2020-08-14 – 2020-08-15 (×4): 1 via ORAL
  Filled 2020-08-14 (×4): qty 1

## 2020-08-14 MED ORDER — OXYCODONE-ACETAMINOPHEN 10-325 MG PO TABS: 1.0000 | ORAL_TABLET | Freq: Four times a day (QID) | ORAL | Status: DC | PRN
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
## 2020-08-14 NOTE — Progress Notes (Addendum)
0800 PT refused taking insulin because he takes synjardy at home. PT educated on synjardy medication being put on hold while he's admitted. PT educated on NPO status but will continue to ask for water, patient given half a cup of water and ice to sip when he takes his 0800 medicine. PT refuses to take allopurinol and fenofibrate medication because he is on NPO and states he only takes the two medications with food, if not he gets nauseated. PT complains of ferrous sulfate tablet being the reason for his constipation, this RN will give Colace PRN to address this issue.   Mikki Harbor, RN
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
## 2020-08-15 DIAGNOSIS — Z9081 Acquired absence of spleen: Secondary | ICD-10-CM

## 2020-08-15 DIAGNOSIS — M545 Low back pain, unspecified: Secondary | ICD-10-CM

## 2020-08-15 DIAGNOSIS — G8929 Other chronic pain: Secondary | ICD-10-CM

## 2020-08-15 DIAGNOSIS — M549 Dorsalgia, unspecified: Secondary | ICD-10-CM

## 2020-08-15 LAB — GLUCOSE, CAPILLARY: Glucose-Capillary: 143 mg/dL — ABNORMAL HIGH (ref 70–99)

## 2020-08-15 MED ORDER — FLUCONAZOLE 100 MG PO TABS: 200.0000 mg | ORAL_TABLET | Freq: Every day | ORAL | 0 refills | Status: AC
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
## 2020-08-15 NOTE — Progress Notes (Signed)
Patient refused CBG checks throughout the night. He wants to check CBG during the day only.
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
## 2020-08-15 NOTE — Discharge Summary (Signed)
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 discussion of the procedural risks, benefits and alternatives. All questions were addressed. Maximal Sterile Barrier Technique was utilized including caps, mask, sterile gowns, sterile gloves, sterile drape, hand hygiene and skin antiseptic. A timeout was performed prior to the initiation of the procedure. Ultrasound survey of the right upper quadrant was performed for planning purposes. Once the patient is prepped and draped in the usual sterile fashion, the skin and subcutaneous tissues overlying the gallbladder were generously infiltrated 1% lidocaine for local anesthesia. A coaxial needle was advanced under ultrasound guidance through the skin subcutaneous tissues and a small segment of liver into the gallbladder lumen. With removal of the stylet, spontaneous dark bile drainage occurred. Using modified Seldinger technique, a 10 French drain was placed into the gallbladder fossa, with aspiration of the sample for the lab. Contrast injection confirmed position of the tube within the gallbladder lumen. Drainage catheter was attached to gravity drain with a suture retention placed. Patient  tolerated the procedure well and remained hemodynamically stable throughout. No complications were encountered and no significant blood loss encountered. IMPRESSION: Status post percutaneous cholecystostomy Signed, Dulcy Fanny. Dellia Nims, RPVI [facility] Radiology Electronically Signed   By: Corrie Mckusick D.O.   On: 07/22/2020 17:21   IR US Guide Vasc Access Right  Result Date: 07/31/2020 INDICATION: 55 year old male with a history of acute cholecystitis and gallstone pancreatitis. He underwent placement of a percutaneous cholecystostomy tube on 07/22/2020. Since that time, he has had persistent hemobilia with decreasing hemoglobin requiring transfusion. He presents today for arteriogram and biliary tube exchange in an effort to identify a treatable source of bleeding. EXAM: IR EMBO ART VEN HEMORR LYMPH EXTRAV INC GUIDE ROADMAPPING; IR EXCHANGE BILARY DRAIN; IR ULTRASOUND GUIDANCE VASC ACCESS RIGHT; ADDITIONAL ARTERIOGRAPHY MEDICATIONS: None. ANESTHESIA/SEDATION: Moderate (conscious) sedation was employed during this procedure. A total of Versed 1.5 mg and Fentanyl 75 mcg was administered intravenously. Moderate Sedation Time: 49 minutes. The patient's level of consciousness and vital signs were monitored continuously by radiology nursing throughout the procedure under my direct supervision. CONTRAST:  22mL OMNIPAQUE IOHEXOL 300 MG/ML SOLN, 71mL OMNIPAQUE IOHEXOL 300 MG/ML SOLN FLUOROSCOPY TIME:  Fluoroscopy Time: 5 minutes 18 seconds (747 mGy). COMPLICATIONS: None immediate. PROCEDURE: Informed consent was obtained from the patient following explanation of the procedure, risks, benefits and alternatives. The patient understands, agrees and consents for the procedure. All questions were addressed. A time out was performed prior to the initiation of the procedure. Maximal barrier sterile technique utilized including caps, mask, sterile gowns, sterile gloves, large  sterile drape, hand hygiene, and Betadine prep. The right common femoral artery was interrogated with ultrasound and found to be widely patent. An image was obtained and stored for the medical record. Local anesthesia was attained by infiltration with 1% lidocaine. A small dermatotomy was made. Under real-time sonographic guidance, the vessel was punctured with a 21 gauge micropuncture needle. Using standard technique, the initial micro  needle was exchanged over a 0.018 micro wire for a transitional 4 [place] micro sheath. The micro sheath was then exchanged over a 0.035 wire for a 5 French vascular sheath. A C2 cobra catheter was used to select the celiac axis. A celiac arteriogram was performed. Variant hepatic arterial anatomy is identified. The celiac axis gives rise to the common hepatic artery which then bifurcates into the left hepatic artery and the gastroduodenal artery. A large left gastric artery is present as well as the splenic artery and the inferior phrenic artery. The C2 cobra catheter was next used to select the superior mesenteric artery. An arteriogram was performed. There is a replaced right hepatic artery. Using a Glidewire, the C2 cobra catheter was advanced into the right hepatic artery. Additional arteriography was performed. The right branch of the middle colic artery arises from the replaced right hepatic artery. Attention was next turned to the percutaneous transhepatic cholecystostomy tube. The cholecystostomy tube was transected and gently removed over a 0.035 wire. Leaving just the wire in place, additional arteriography was performed in multiple obliquities. No evidence of arterial injury of any of the peripheral right hepatic artery branches traveling in proximity to the percutaneous cholecystostomy tube. However, there is marked hyperemia along the hepatic surface of the gallbladder. This is the most prominent finding on the angiogram. At this time, further evaluation via super  selective catheterization utilizing a microcatheter was felt to be necessary. Therefore, a renegade STC microcatheter was advanced over a Fathom 16 wire and used to select the segment 5 hepatic artery. Arteriography was performed in multiple obliquities. The peripheral branches of the segment 5 hepatic artery pass in close approximation with the course of the transhepatic wire. However, the arteries appear in tire Lea normal with no evidence of spasm, irregularity or pseudoaneurysm. No active bleeding is identified. Given the significant hyperemia along the hepatic surface of the gallbladder, the decision was made to proceed with further evaluation. The microcatheter was successfully advanced into the cystic artery and additional arteriography was performed. No evidence of active bleeding. There is hyperemia along the hepatic surface of the gallbladder as previously noted. Given the patient's persistent hemobilia and the high risk of surgical cholecystectomy, the decision was made to proceed with proximal embolization of the cystic artery in an effort to decrease arterial perfusion to the region of hyperemia in the hopes that this would decrease further bleeding. Coil embolization was performed using two 2x6 fibered interlock detachable coils. Follow-up arteriography demonstrates successful decreased flow in the cystic artery and significantly decreased hyperemia at the hepatic to gallbladder interface. Again, no active bleeding identified on the final images. Attention was returned to the percutaneous biliary drainage catheter. A new Cook 10 [place] all-purpose biliary drain was advanced over the wire and into the gallbladder lumen a gentle hand injection of contrast material opacifies the gallbladder lumen which is significantly distended by thrombus. The catheter was gently flushed, connected to bag drainage and secured to the skin with 0 Prolene suture. The catheter and sheath were removed and hemostasis attained  with the assistance of a Celt closure device. IMPRESSION: 1. No evidence of arterial injury related to the percutaneous transhepatic cholecystostomy tube. 2. There is marked hyperemia along the hepatic surface of the gallbladder. This suggests that bleeding may be coming from an irritated and friable gallbladder wall. 3. Therefore, proximal coil embolization of the cystic artery was performed. On final angiography the significantly decreased the degree of hyperemia along the hepatic surface of the gallbladder.  4. Successful exchange of 10 French percutaneous transhepatic cholecystostomy tube. PLAN: 1. Continue to trend H and H and transfuse as needed. 2. Follow cholecystostomy tube output. If hemobilia begins to resolve, then we will know the procedure was successful. If hemobilia persists in additional transfusions are required, patient may ultimately require cholecystectomy. Signed, Criselda Peaches, MD, [place] [facility] Radiology Electronically Signed   By: Jacqulynn Cadet M.D.   On: 07/31/2020 09:26   IR US Guidance  Result Date: 07/22/2020 INDICATION: 55 year old male with acute cholecystitis EXAM: CHOLECYSTOSTOMY; IR ULTRASOUND GUIDANCE MEDICATIONS: None ANESTHESIA/SEDATION: Moderate (conscious) sedation was employed during this procedure. A total of Versed 1.0 mg and Fentanyl 50 mcg was administered intravenously. Moderate Sedation Time: 10 minutes. The patient's level of consciousness and vital signs were monitored continuously by radiology nursing throughout the procedure under my direct supervision. FLUOROSCOPY TIME:  Fluoroscopy Time: 0 minutes 24 seconds (3 mGy). COMPLICATIONS: None PROCEDURE: Informed written consent was obtained from the patient and the patient's family after a thorough discussion of the procedural risks, benefits and alternatives. All questions were addressed. Maximal Sterile Barrier Technique was utilized including caps, mask,  sterile gowns, sterile gloves, sterile drape, hand hygiene and skin antiseptic. A timeout was performed prior to the initiation of the procedure. Ultrasound survey of the right upper quadrant was performed for planning purposes. Once the patient is prepped and draped in the usual sterile fashion, the skin and subcutaneous tissues overlying the gallbladder were generously infiltrated 1% lidocaine for local anesthesia. A coaxial needle was advanced under ultrasound guidance through the skin subcutaneous tissues and a small segment of liver into the gallbladder lumen. With removal of the stylet, spontaneous dark bile drainage occurred. Using modified Seldinger technique, a 10 French drain was placed into the gallbladder fossa, with aspiration of the sample for the lab. Contrast injection confirmed position of the tube within the gallbladder lumen. Drainage catheter was attached to gravity drain with a suture retention placed. Patient tolerated the procedure well and remained hemodynamically stable throughout. No complications were encountered and no significant blood loss encountered. IMPRESSION: Status post percutaneous cholecystostomy Signed, Dulcy Fanny. Dellia Nims, RPVI V[facility] Interventional Radiology Specialists [facility] Radiology Electronically Signed   By: Corrie Mckusick D.O.   On: 07/22/2020 17:21   DG CHEST PORT 1 VIEW  Result Date: 07/24/2020 CLINICAL DATA:  Chest pain EXAM: PORTABLE CHEST 1 VIEW COMPARISON:  05/26/2020 and prior radiographs FINDINGS: This is a mildly low volume film. Cardiomediastinal silhouette is unchanged. Mild bibasilar atelectasis identified. There is no evidence of focal airspace disease, pulmonary edema, suspicious pulmonary nodule/mass, pleural effusion, or pneumothorax. No acute bony abnormalities are identified. IMPRESSION: Mildly low volume film with mild bibasilar atelectasis. Electronically Signed   By: Margarette Canada M.D.   On: 07/24/2020 09:43   DG Abd Portable  1V  Result Date: 07/24/2020 CLINICAL DATA:  Acute cholecystitis and percutaneous cholecystostomy. EXAM: PORTABLE ABDOMEN - 1 VIEW COMPARISON:  07/21/2020 CT FINDINGS: UPPER limits normal gas-filled small bowel loops within the LEFT abdomen may represent a mild ileus. A RIGHT cholecystostomy tube is present. No other significant abnormalities identified. Gas in the colon and rectum are present. IMPRESSION: Question mild ileus. RIGHT cholecystostomy tube. Electronically Signed   By: Margarette Canada M.D.   On: 07/24/2020 09:45   IR EXCHANGE BILIARY DRAIN  Result Date: 07/31/2020 INDICATION: 55 year old male with a history of acute cholecystitis and gallstone pancreatitis. He underwent placement of a percutaneous cholecystostomy tube on 07/22/2020.  Since that time, he has had persistent hemobilia with decreasing hemoglobin requiring transfusion. He presents today for arteriogram and biliary tube exchange in an effort to identify a treatable source of bleeding. EXAM: IR EMBO ART VEN HEMORR LYMPH EXTRAV INC GUIDE ROADMAPPING; IR EXCHANGE BILARY DRAIN; IR ULTRASOUND GUIDANCE VASC ACCESS RIGHT; ADDITIONAL ARTERIOGRAPHY MEDICATIONS: None. ANESTHESIA/SEDATION: Moderate (conscious) sedation was employed during this procedure. A total of Versed 1.5 mg and Fentanyl 75 mcg was administered intravenously. Moderate Sedation Time: 49 minutes. The patient's level of consciousness and vital signs were monitored continuously by radiology nursing throughout the procedure under my direct supervision. CONTRAST:  64mL OMNIPAQUE IOHEXOL 300 MG/ML SOLN, 36mL OMNIPAQUE IOHEXOL 300 MG/ML SOLN FLUOROSCOPY TIME:  Fluoroscopy Time: 5 minutes 18 seconds (747 mGy). COMPLICATIONS: None immediate. PROCEDURE: Informed consent was obtained from the patient following explanation of the procedure, risks, benefits and alternatives. The patient understands, agrees and consents for the procedure. All questions were addressed. A time out was performed prior  to the initiation of the procedure. Maximal barrier sterile technique utilized including caps, mask, sterile gowns, sterile gloves, large sterile drape, hand hygiene, and Betadine prep. The right common femoral artery was interrogated with ultrasound and found to be widely patent. An image was obtained and stored for the medical record. Local anesthesia was attained by infiltration with 1% lidocaine. A small dermatotomy was made. Under real-time sonographic guidance, the vessel was punctured with a 21 gauge micropuncture needle. Using standard technique, the initial micro needle was exchanged over a 0.018 micro wire for a transitional 4 [place] micro sheath. The micro sheath was then exchanged over a 0.035 wire for a 5 French vascular sheath. A C2 cobra catheter was used to select the celiac axis. A celiac arteriogram was performed. Variant hepatic arterial anatomy is identified. The celiac axis gives rise to the common hepatic artery which then bifurcates into the left hepatic artery and the gastroduodenal artery. A large left gastric artery is present as well as the splenic artery and the inferior phrenic artery. The C2 cobra catheter was next used to select the superior mesenteric artery. An arteriogram was performed. There is a replaced right hepatic artery. Using a Glidewire, the C2 cobra catheter was advanced into the right hepatic artery. Additional arteriography was performed. The right branch of the middle colic artery arises from the replaced right hepatic artery. Attention was next turned to the percutaneous transhepatic cholecystostomy tube. The cholecystostomy tube was transected and gently removed over a 0.035 wire. Leaving just the wire in place, additional arteriography was performed in multiple obliquities. No evidence of arterial injury of any of the peripheral right hepatic artery branches traveling in proximity to the percutaneous cholecystostomy tube. However, there is marked hyperemia along the  hepatic surface of the gallbladder. This is the most prominent finding on the angiogram. At this time, further evaluation via super selective catheterization utilizing a microcatheter was felt to be necessary. Therefore, a renegade STC microcatheter was advanced over a Fathom 16 wire and used to select the segment 5 hepatic artery. Arteriography was performed in multiple obliquities. The peripheral branches of the segment 5 hepatic artery pass in close approximation with the course of the transhepatic wire. However, the arteries appear in tire Lea normal with no evidence of spasm, irregularity or pseudoaneurysm. No active bleeding is identified. Given the significant hyperemia along the hepatic surface of the gallbladder, the decision was made to proceed with further evaluation. The microcatheter was successfully advanced into the cystic artery and additional  arteriography was performed. No evidence of active bleeding. There is hyperemia along the hepatic surface of the gallbladder as previously noted. Given the patient's persistent hemobilia and the high risk of surgical cholecystectomy, the decision was made to proceed with proximal embolization of the cystic artery in an effort to decrease arterial perfusion to the region of hyperemia in the hopes that this would decrease further bleeding. Coil embolization was performed using two 2x6 fibered interlock detachable coils. Follow-up arteriography demonstrates successful decreased flow in the cystic artery and significantly decreased hyperemia at the hepatic to gallbladder interface. Again, no active bleeding identified on the final images. Attention was returned to the percutaneous biliary drainage catheter. A new Cook 10 [place] all-purpose biliary drain was advanced over the wire and into the gallbladder lumen a gentle hand injection of contrast material opacifies the gallbladder lumen which is significantly distended by thrombus. The catheter was gently flushed,  connected to bag drainage and secured to the skin with 0 Prolene suture. The catheter and sheath were removed and hemostasis attained with the assistance of a Celt closure device. IMPRESSION: 1. No evidence of arterial injury related to the percutaneous transhepatic cholecystostomy tube. 2. There is marked hyperemia along the hepatic surface of the gallbladder. This suggests that bleeding may be coming from an irritated and friable gallbladder wall. 3. Therefore, proximal coil embolization of the cystic artery was performed. On final angiography the significantly decreased the degree of hyperemia along the hepatic surface of the gallbladder. 4. Successful exchange of 10 French percutaneous transhepatic cholecystostomy tube. PLAN: 1. Continue to trend H and H and transfuse as needed. 2. Follow cholecystostomy tube output. If hemobilia begins to resolve, then we will know the procedure was successful. If hemobilia persists in additional transfusions are required, patient may ultimately require cholecystectomy. Signed, Criselda Peaches, MD, [place] [facility] Inter[facility] Radiology Special[facility] Radiology Electronically Signed   By: Jacqulynn Cadet M.D.   On: 07/31/2020 09:26   IR EMBO ART  VEN HEMORR LYMPH EXTRAV  INC GUIDE ROADMAPPING  Result Date: 07/31/2020 INDICATION: 55 year old male with a history of acute cholecystitis and gallstone pancreatitis. He underwent placement of a percutaneous cholecystostomy tube on 07/22/2020. Since that time, he has had persistent hemobilia with decreasing hemoglobin requiring transfusion. He presents today for arteriogram and biliary tube exchange in an effort to identify a treatable source of bleeding. EXAM: IR EMBO ART VEN HEMORR LYMPH EXTRAV INC GUIDE ROADMAPPING; IR EXCHANGE BILARY DRAIN; IR ULTRASOUND GUIDANCE VASC ACCESS RIGHT; ADDITIONAL ARTERIOGRAPHY MEDICATIONS: None. ANESTHESIA/SEDATION: Moderate (conscious) sedation was employed during this procedure.  A total of Versed 1.5 mg and Fentanyl 75 mcg was administered intravenously. Moderate Sedation Time: 49 minutes. The patient's level of consciousness and vital signs were monitored continuously by radiology nursing throughout the procedure under my direct supervision. CONTRAST:  21mL OMNIPAQUE IOHEXOL 300 MG/ML SOLN, 68mL OMNIPAQUE IOHEXOL 300 MG/ML SOLN FLUOROSCOPY TIME:  Fluoroscopy Time: 5 minutes 18 seconds (747 mGy). COMPLICATIONS: None immediate. PROCEDURE: Informed consent was obtained from the patient following explanation of the procedure, risks, benefits and alternatives. The patient understands, agrees and consents for the procedure. All questions were addressed. A time out was performed prior to the initiation of the procedure. Maximal barrier sterile technique utilized including caps, mask, sterile gowns, sterile gloves, large sterile drape, hand hygiene, and Betadine prep. The right common femoral artery was interrogated with ultrasound and found to be widely patent. An image was obtained and stored for the medical record. Local anesthesia was attained  by infiltration with 1% lidocaine. A small dermatotomy was made. Under real-time sonographic guidance, the vessel was punctured with a 21 gauge micropuncture needle. Using standard technique, the initial micro needle was exchanged over a 0.018 micro wire for a transitional 4 [place] micro sheath. The micro sheath was then exchanged over a 0.035 wire for a 5 French vascular sheath. A C2 cobra catheter was used to select the celiac axis. A celiac arteriogram was performed. Variant hepatic arterial anatomy is identified. The celiac axis gives rise to the common hepatic artery which then bifurcates into the left hepatic artery and the gastroduodenal artery. A large left gastric artery is present as well as the splenic artery and the inferior phrenic artery. The C2 cobra catheter was next used to select the superior mesenteric artery. An arteriogram was  performed. There is a replaced right hepatic artery. Using a Glidewire, the C2 cobra catheter was advanced into the right hepatic artery. Additional arteriography was performed. The right branch of the middle colic artery arises from the replaced right hepatic artery. Attention was next turned to the percutaneous transhepatic cholecystostomy tube. The cholecystostomy tube was transected and gently removed over a 0.035 wire. Leaving just the wire in place, additional arteriography was performed in multiple obliquities. No evidence of arterial injury of any of the peripheral right hepatic artery branches traveling in proximity to the percutaneous cholecystostomy tube. However, there is marked hyperemia along the hepatic surface of the gallbladder. This is the most prominent finding on the angiogram. At this time, further evaluation via super selective catheterization utilizing a microcatheter was felt to be necessary. Therefore, a renegade STC microcatheter was advanced over a Fathom 16 wire and used to select the segment 5 hepatic artery. Arteriography was performed in multiple obliquities. The peripheral branches of the segment 5 hepatic artery pass in close approximation with the course of the transhepatic wire. However, the arteries appear in tire Lea normal with no evidence of spasm, irregularity or pseudoaneurysm. No active bleeding is identified. Given the significant hyperemia along the hepatic surface of the gallbladder, the decision was made to proceed with further evaluation. The microcatheter was successfully advanced into the cystic artery and additional arteriography was performed. No evidence of active bleeding. There is hyperemia along the hepatic surface of the gallbladder as previously noted. Given the patient's persistent hemobilia and the high risk of surgical cholecystectomy, the decision was made to proceed with proximal embolization of the cystic artery in an effort to decrease arterial  perfusion to the region of hyperemia in the hopes that this would decrease further bleeding. Coil embolization was performed using two 2x6 fibered interlock detachable coils. Follow-up arteriography demonstrates successful decreased flow in the cystic artery and significantly decreased hyperemia at the hepatic to gallbladder interface. Again, no active bleeding identified on the final images. Attention was returned to the percutaneous biliary drainage catheter. A new Cook 10 [place] all-purpose biliary drain was advanced over the wire and into the gallbladder lumen a gentle hand injection of contrast material opacifies the gallbladder lumen which is significantly distended by thrombus. The catheter was gently flushed, connected to bag drainage and secured to the skin with 0 Prolene suture. The catheter and sheath were removed and hemostasis attained with the assistance of a Celt closure device. IMPRESSION: 1. No evidence of arterial injury related to the percutaneous transhepatic cholecystostomy tube. 2. There is marked hyperemia along the hepatic surface of the gallbladder. This suggests that bleeding may be coming from an irritated and friable  gallbladder wall. 3. Therefore, proximal coil embolization of the cystic artery was performed. On final angiography the significantly decreased the degree of hyperemia along the hepatic surface of the gallbladder. 4. Successful exchange of 10 French percutaneous transhepatic cholecystostomy tube. PLAN: 1. Continue to trend H and H and transfuse as needed. 2. Follow cholecystostomy tube output. If

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
## 2020-08-16 LAB — GLUCOSE, CAPILLARY: Glucose-Capillary: 294 mg/dL — ABNORMAL HIGH (ref 70–99)

## 2020-08-17 ENCOUNTER — Telehealth: Payer: Self-pay | Admitting: Nurse Practitioner

## 2020-08-17 NOTE — Telephone Encounter (Signed)
Received a call from Mr. Yi who wanted to cancel his appt w/ Misty Stanley on 4/29. Per pt he was told by his surgeon  platelets were within normal range.

## 2020-08-18 ENCOUNTER — Other Ambulatory Visit (HOSPITAL_COMMUNITY)
Admission: RE | Admit: 2020-08-18 | Discharge: 2020-08-18 | Disposition: A | Discharge status: Home / Self Care | Payer: 59 | Source: Ambulatory Visit | Attending: Gastroenterology | Admitting: Gastroenterology

## 2020-08-18 ENCOUNTER — Other Ambulatory Visit: Payer: Self-pay | Admitting: Gastroenterology

## 2020-08-18 DIAGNOSIS — Z01812 Encounter for preprocedural laboratory examination: Secondary | ICD-10-CM | POA: Insufficient documentation

## 2020-08-18 DIAGNOSIS — Z20822 Contact with and (suspected) exposure to covid-19: Secondary | ICD-10-CM | POA: Diagnosis not present

## 2020-08-18 LAB — CULTURE, BLOOD (ROUTINE X 2)
Culture: NO GROWTH
Culture: NO GROWTH

## 2020-08-18 LAB — SARS CORONAVIRUS 2 (TAT 6-24 HRS): SARS Coronavirus 2: NEGATIVE

## 2020-08-19 ENCOUNTER — Encounter (HOSPITAL_COMMUNITY): Payer: Self-pay | Admitting: Gastroenterology

## 2020-08-19 ENCOUNTER — Other Ambulatory Visit: Payer: Self-pay

## 2020-08-19 NOTE — Anesthesia Preprocedure Evaluation (Addendum)
Anesthesia Evaluation  Patient identified by MRN, date of birth, ID band Patient awake    Reviewed: Allergy & Precautions, NPO status , Patient's Chart, lab work & pertinent test results  History of Anesthesia Complications Negative for: history of anesthetic complications  Airway Mallampati: I  TM Distance: >3 FB Neck ROM: Full    Dental  (+) Dental Advisory Given, Teeth Intact   Pulmonary neg pulmonary ROS,    Pulmonary exam normal        Cardiovascular negative cardio ROS Normal cardiovascular exam   '22 TTE - EF 50 to 55%. Mild left ventricular hypertrophy. There is mild dilatation of the ascending aorta, measuring 40 mm.     Neuro/Psych negative neurological ROS  negative psych ROS   GI/Hepatic negative GI ROS, Neg liver ROS,   Endo/Other  diabetes, Type 2, Oral Hypoglycemic Agents   Renal/GU negative Renal ROS     Musculoskeletal negative musculoskeletal ROS (+)  Gout    Abdominal   Peds  Hematology  (+) anemia ,  Plt 598k    Anesthesia Other Findings Covid test negative   Reproductive/Obstetrics                            Anesthesia Physical Anesthesia Plan  ASA: II  Anesthesia Plan: General   Post-op Pain Management:    Induction: Intravenous  PONV Risk Score and Plan: 2 and Treatment may vary due to age or medical condition, Ondansetron, Dexamethasone and Midazolam  Airway Management Planned: Oral ETT  Additional Equipment: None  Intra-op Plan:   Post-operative Plan: Extubation in OR  Informed Consent: I have reviewed the patients History and Physical, chart, labs and discussed the procedure including the risks, benefits and alternatives for the proposed anesthesia with the patient or authorized representative who has indicated his/her understanding and acceptance.     Dental advisory given  Plan Discussed with: CRNA and Anesthesiologist  Anesthesia  Plan Comments:        Anesthesia Quick Evaluation

## 2020-08-19 NOTE — Progress Notes (Signed)
Attempted to obtain medical history via telephone, unable to reach at this time. I left a voicemail to return pre surgical testing department's phone call.  

## 2020-08-20 ENCOUNTER — Ambulatory Visit (HOSPITAL_COMMUNITY): Payer: 59 | Admitting: Anesthesiology

## 2020-08-20 ENCOUNTER — Encounter (HOSPITAL_COMMUNITY): Payer: Self-pay | Admitting: Gastroenterology

## 2020-08-20 ENCOUNTER — Encounter (HOSPITAL_COMMUNITY)
Admission: RE | Disposition: A | Discharge status: Home / Self Care | Payer: Self-pay | Source: Home / Self Care | Attending: Gastroenterology

## 2020-08-20 ENCOUNTER — Ambulatory Visit (HOSPITAL_COMMUNITY): Payer: 59

## 2020-08-20 ENCOUNTER — Ambulatory Visit (HOSPITAL_COMMUNITY)
Admission: RE | Admit: 2020-08-20 | Discharge: 2020-08-20 | Disposition: A | Discharge status: Home / Self Care | Payer: 59 | Attending: Gastroenterology | Admitting: Gastroenterology

## 2020-08-20 DIAGNOSIS — K760 Fatty (change of) liver, not elsewhere classified: Secondary | ICD-10-CM | POA: Insufficient documentation

## 2020-08-20 DIAGNOSIS — N281 Cyst of kidney, acquired: Secondary | ICD-10-CM | POA: Insufficient documentation

## 2020-08-20 DIAGNOSIS — N200 Calculus of kidney: Secondary | ICD-10-CM | POA: Insufficient documentation

## 2020-08-20 DIAGNOSIS — K859 Acute pancreatitis without necrosis or infection, unspecified: Secondary | ICD-10-CM

## 2020-08-20 DIAGNOSIS — K851 Biliary acute pancreatitis without necrosis or infection: Secondary | ICD-10-CM | POA: Insufficient documentation

## 2020-08-20 HISTORY — PX: SPHINCTEROTOMY: SHX5544

## 2020-08-20 HISTORY — PX: ENDOSCOPIC RETROGRADE CHOLANGIOPANCREATOGRAPHY (ERCP) WITH PROPOFOL: SHX5810

## 2020-08-20 LAB — GLUCOSE, CAPILLARY: Glucose-Capillary: 140 mg/dL — ABNORMAL HIGH (ref 70–99)

## 2020-08-20 SURGERY — ENDOSCOPIC RETROGRADE CHOLANGIOPANCREATOGRAPHY (ERCP) WITH PROPOFOL
Anesthesia: General

## 2020-08-20 MED ORDER — ROCURONIUM BROMIDE 10 MG/ML (PF) SYRINGE
PREFILLED_SYRINGE | INTRAVENOUS | Status: DC | PRN
  Administered 2020-08-20: 50 mg via INTRAVENOUS

## 2020-08-20 MED ORDER — CIPROFLOXACIN IN D5W 400 MG/200ML IV SOLN
INTRAVENOUS | Status: DC | PRN
  Administered 2020-08-20: 400 mg via INTRAVENOUS

## 2020-08-20 MED ORDER — PROPOFOL 500 MG/50ML IV EMUL
INTRAVENOUS | Status: AC
  Filled 2020-08-20: qty 50

## 2020-08-20 MED ORDER — FENTANYL CITRATE (PF) 100 MCG/2ML IJ SOLN: 25.0000 ug | INTRAMUSCULAR | Status: DC | PRN

## 2020-08-20 MED ORDER — INDOMETHACIN 50 MG RE SUPP
RECTAL | Status: AC
  Filled 2020-08-20: qty 2

## 2020-08-20 MED ORDER — LIDOCAINE 2% (20 MG/ML) 5 ML SYRINGE
INTRAMUSCULAR | Status: DC | PRN
  Administered 2020-08-20: 100 mg via INTRAVENOUS

## 2020-08-20 MED ORDER — SUGAMMADEX SODIUM 200 MG/2ML IV SOLN
INTRAVENOUS | Status: DC | PRN
  Administered 2020-08-20: 200 mg via INTRAVENOUS

## 2020-08-20 MED ORDER — LACTATED RINGERS IV SOLN: INTRAVENOUS | Status: DC

## 2020-08-20 MED ORDER — INDOMETHACIN 50 MG RE SUPP
RECTAL | Status: DC | PRN
  Administered 2020-08-20: 100 mg via RECTAL

## 2020-08-20 MED ORDER — FENTANYL CITRATE (PF) 100 MCG/2ML IJ SOLN
INTRAMUSCULAR | Status: DC | PRN
  Administered 2020-08-20: 100 ug via INTRAVENOUS
  Administered 2020-08-20: 50 ug via INTRAVENOUS

## 2020-08-20 MED ORDER — OXYCODONE HCL 5 MG/5ML PO SOLN: 5.0000 mg | Freq: Once | ORAL | Status: DC | PRN

## 2020-08-20 MED ORDER — FENTANYL CITRATE (PF) 100 MCG/2ML IJ SOLN
INTRAMUSCULAR | Status: AC
  Filled 2020-08-20: qty 2

## 2020-08-20 MED ORDER — PROPOFOL 10 MG/ML IV BOLUS
INTRAVENOUS | Status: DC | PRN
  Administered 2020-08-20: 200 mg via INTRAVENOUS

## 2020-08-20 MED ORDER — MIDAZOLAM HCL 5 MG/5ML IJ SOLN
INTRAMUSCULAR | Status: DC | PRN
  Administered 2020-08-20: 2 mg via INTRAVENOUS

## 2020-08-20 MED ORDER — GLUCAGON HCL RDNA (DIAGNOSTIC) 1 MG IJ SOLR
INTRAMUSCULAR | Status: AC
  Filled 2020-08-20: qty 1

## 2020-08-20 MED ORDER — ONDANSETRON HCL 4 MG/2ML IJ SOLN
INTRAMUSCULAR | Status: DC | PRN
  Administered 2020-08-20: 4 mg via INTRAVENOUS

## 2020-08-20 MED ORDER — PROPOFOL 500 MG/50ML IV EMUL
INTRAVENOUS | Status: AC
  Filled 2020-08-20: qty 200

## 2020-08-20 MED ORDER — OXYCODONE HCL 5 MG PO TABS: 5.0000 mg | ORAL_TABLET | Freq: Once | ORAL | Status: DC | PRN

## 2020-08-20 MED ORDER — PROMETHAZINE HCL 25 MG/ML IJ SOLN: 6.2500 mg | INTRAMUSCULAR | Status: DC | PRN

## 2020-08-20 MED ORDER — DEXAMETHASONE SODIUM PHOSPHATE 10 MG/ML IJ SOLN
INTRAMUSCULAR | Status: DC | PRN
  Administered 2020-08-20: 10 mg via INTRAVENOUS

## 2020-08-20 MED ORDER — MIDAZOLAM HCL 2 MG/2ML IJ SOLN
INTRAMUSCULAR | Status: AC
  Filled 2020-08-20: qty 2

## 2020-08-20 MED ORDER — CIPROFLOXACIN IN D5W 400 MG/200ML IV SOLN
INTRAVENOUS | Status: AC
  Filled 2020-08-20: qty 200

## 2020-08-20 NOTE — Interval H&P Note (Signed)
History and Physical Interval Note:  08/20/2020 9:36 AM  Billy Cowan  has presented today for surgery, with the diagnosis of stent placement.  The various methods of treatment have been discussed with the patient and family. After consideration of risks, benefits and other options for treatment, the patient has consented to  Procedure(s): ENDOSCOPIC RETROGRADE CHOLANGIOPANCREATOGRAPHY (ERCP) WITH PROPOFOL (N/A) as a surgical intervention.  The patient's history has been reviewed, patient examined, no change in status, stable for surgery.  I have reviewed the patient's chart and labs.  Questions were answered to the patient's satisfaction.     Eppie Barhorst D

## 2020-08-20 NOTE — Anesthesia Postprocedure Evaluation (Signed)
Anesthesia Post Note  Patient: Billy Cowan  Procedure(s) Performed: ENDOSCOPIC RETROGRADE CHOLANGIOPANCREATOGRAPHY (ERCP) WITH PROPOFOL (N/A ) SPHINCTEROTOMY     Patient location during evaluation: PACU Anesthesia Type: General Level of consciousness: awake and alert Pain management: pain level controlled Vital Signs Assessment: post-procedure vital signs reviewed and stable Respiratory status: spontaneous breathing, nonlabored ventilation and respiratory function stable Cardiovascular status: blood pressure returned to baseline and stable Postop Assessment: no apparent nausea or vomiting Anesthetic complications: no   No complications documented.  Last Vitals:  Vitals:   08/20/20 1200 08/20/20 1210  BP: (!) 141/96 (!) 132/96  Pulse: 94 93  Resp: 12 19  Temp:    SpO2: 99% 99%    Last Pain:  Vitals:   08/20/20 1210  TempSrc:   PainSc: 0-No pain                 Beryle Lathe

## 2020-08-20 NOTE — Transfer of Care (Signed)
Immediate Anesthesia Transfer of Care Note  Patient: Billy Cowan  Procedure(s) Performed: Procedure(s): ENDOSCOPIC RETROGRADE CHOLANGIOPANCREATOGRAPHY (ERCP) WITH PROPOFOL (N/A) SPHINCTEROTOMY  Patient Location: PACU  Anesthesia Type:General  Level of Consciousness: Alert, Awake, Oriented  Airway & Oxygen Therapy: Patient Spontanous Breathing  Post-op Assessment: Report given to RN  Post vital signs: Reviewed and stable  Last Vitals:  Vitals:   08/20/20 0815 08/20/20 1138  BP: (!) 132/102 127/77  Pulse: 100 91  Resp: (!) 21 14  Temp: 36.6 C   SpO2: 100% 100%    Complications: No apparent anesthesia complications

## 2020-08-20 NOTE — Anesthesia Procedure Notes (Signed)
Procedure Name: Intubation Date/Time: 08/20/2020 9:43 AM Performed by: Gerald Leitz, CRNA Pre-anesthesia Checklist: Patient identified, Patient being monitored, Timeout performed, Emergency Drugs available and Suction available Patient Re-evaluated:Patient Re-evaluated prior to induction Oxygen Delivery Method: Circle system utilized Preoxygenation: Pre-oxygenation with 100% oxygen Induction Type: IV induction Ventilation: Mask ventilation without difficulty Laryngoscope Size: Mac and 3 Grade View: Grade I Tube type: Oral Tube size: 7.5 mm Number of attempts: 1 Placement Confirmation: ETT inserted through vocal cords under direct vision,  positive ETCO2 and breath sounds checked- equal and bilateral Secured at: 22 cm Tube secured with: Tape Dental Injury: Teeth and Oropharynx as per pre-operative assessment

## 2020-08-20 NOTE — Op Note (Addendum)
[facility] Patient Name: Billy DexterJoseph Cowan Procedure Date: 08/20/2020 MRN: [id number] Attending MD: Jeani HawkingPatrick Daylan Boggess , MD Date of Birth: 07/06/1965 CSN: [id number] Age: 55 Admit Type: Outpatient Procedure:                ERCP Indications:              For therapy of acute recurrent pancreatitis Providers:                Jeani HawkingPatrick Rockie Vawter, MD, [place] BailiffMaggie Chrismon, RN, Sunday CornFaustina                            Mbumina, Technician Referring MD:              Medicines:                General Anesthesia Complications:            No immediate complications. Estimated Blood Loss:     Estimated blood loss: none. Procedure:                Pre-Anesthesia Assessment:                           - Prior to the procedure, a History and Physical                            was performed, and patient medications and                            allergies were reviewed. The patient's tolerance of                            previous anesthesia was also reviewed. The risks                            and benefits of the procedure and the sedation                            options and risks were discussed with the patient.                            All questions were answered, and informed consent                            was obtained. Prior Anticoagulants: The patient has                            taken no previous anticoagulant or antiplatelet                            agents. ASA Grade Assessment: II - A patient with                            mild systemic disease. After reviewing the risks  and benefits, the patient was deemed in                            satisfactory condition to undergo the procedure.                           - Sedation was administered by an anesthesia                            professional. General anesthesia was attained.                           After obtaining informed consent, the scope was                            passed under direct vision.  Throughout the                            procedure, the patient's blood pressure, pulse, and                            oxygen saturations were monitored continuously. The                            [employer] single use                            duodenoscope was introduced through the mouth, and                            used to inject contrast into without successful                            cannulation. The ERCP was extremely difficult. The                            patient tolerated the procedure well. Scope In: Scope Out: Findings:      The major papilla was normal. A 3 mm biliary sphincterotomy was made       with a traction (standard) sphincterotome using ERBE electrocautery.       There was no post-sphincterotomy bleeding.      The procedure was very difficult. The patient has a J-shaped stomach and       the dilated gallbladder caused distortion to the lumen of the stomach.       An adult endoscope was used to deploy a guidewire in to the duodenum to       serve as a guide. Even passage of the EGD scope was difficult and       external pressure was required to allow for intubation of the duodenum.       The duodenoscope was again reinserted and the guidewire was used       visually to allow for proper passage into the second portion of the       duodenum. External pressure was again required to facilitate passage of  the duodenoscope. Once in position the ampulla was identified and       intubation of the ampulla was easily achieved, however, the wire was not       able to be passed with repeated attempts. A submucosal tract was created       during the attempts for cannulation. A Revolution wire was employed, but       this did not allow for cannulation. A very limited sphincterotomy was       created, which did not help. The sphincterotomy was limited as a result       of the cannulation of the sphincterotome into the ampulla. Impression:                - The major papilla appeared normal.                           - A biliary sphincterotomy was performed. Moderate Sedation:      Not Applicable - Patient had care per Anesthesia. Recommendation:           - Patient has a contact number available for                            emergencies. The signs and symptoms of potential                            delayed complications were discussed with the                            patient. Return to normal activities tomorrow.                            Written discharge instructions were provided to the                            patient.                           - Cholecystectomy per Surgery.                           - If a recurrent pancreatitis occurs again, a                            repeat ERCP with one of my other colleauges can be                            pursued. Procedure Code(s):        --- Professional ---                           [phone], Esophagogastroduodenoscopy, flexible,                            transoral; diagnostic, including collection of                            specimen(s) by brushing or washing, when performed                            (  separate procedure) Diagnosis Code(s):        --- Professional ---                           K85.90, Acute pancreatitis without necrosis or                            infection, unspecified CPT copyright 2019 American Medical Association. All rights reserved. The codes documented in this report are preliminary and upon coder review may  be revised to meet current compliance requirements. Jeani Hawking, MD Jeani Hawking, MD 08/20/2020 11:40:25 AM This report has been signed electronically. Number of Addenda: 0

## 2020-08-20 NOTE — Discharge Instructions (Signed)
YOU HAD AN ENDOSCOPIC PROCEDURE TODAY: Refer to the procedure report and other information in the discharge instructions given to you for any specific questions about what was found during the examination. If this information does not answer your questions, please call [facility] at [phone] to clarify.  ? ?YOU SHOULD EXPECT: Some feelings of bloating in the abdomen. Passage of more gas than usual. Walking can help get rid of the air that was put into your GI tract during the procedure and reduce the bloating. ? ?DIET: Your first meal following the procedure should be a light meal and then it is ok to progress to your normal diet. A half-sandwich or bowl of soup is an example of a good first meal. Heavy or fried foods are harder to digest and may make you feel nauseous or bloated. Drink plenty of fluids but you should avoid alcoholic beverages for 24 hours.  ? ?ACTIVITY: Your care partner should take you home directly after the procedure. You should plan to take it easy, moving slowly for the rest of the day. You can resume normal activity the day after the procedure however YOU SHOULD NOT DRIVE, use power tools, machinery or perform tasks that involve climbing or major physical exertion for 24 hours (because of the sedation medicines used during the test).  ? ?SYMPTOMS TO REPORT IMMEDIATELY: ?A gastroenterologist can be reached at any hour. Please call [phone]  for any of the following symptoms:  ? ?Following upper endoscopy (EGD, EUS, ERCP, esophageal dilation) ?Vomiting of blood or coffee ground material  ?New, significant abdominal pain  ?New, significant chest pain or pain under the shoulder blades  ?Painful or persistently difficult swallowing  ?New shortness of breath  ?Black, tarry-looking or red, bloody stools ? ?FOLLOW UP:  ?If any biopsies were taken you will be contacted by phone or by letter within the next 1-3 weeks. Call [phone]  if you have not heard about the biopsies in 3  weeks.  ?Please also call with any specific questions about appointments or follow up tests.  ?

## 2020-08-23 ENCOUNTER — Encounter (HOSPITAL_COMMUNITY): Payer: Self-pay | Admitting: Gastroenterology

## 2020-08-27 ENCOUNTER — Ambulatory Visit: Payer: 59 | Admitting: Nurse Practitioner

## 2020-09-01 ENCOUNTER — Ambulatory Visit
Admission: RE | Admit: 2020-09-01 | Discharge: 2020-09-01 | Disposition: A | Discharge status: Home / Self Care | Payer: 59 | Source: Ambulatory Visit | Attending: General Surgery | Admitting: General Surgery

## 2020-09-01 ENCOUNTER — Ambulatory Visit
Admission: RE | Admit: 2020-09-01 | Discharge: 2020-09-01 | Disposition: A | Discharge status: Home / Self Care | Payer: 59 | Source: Ambulatory Visit | Attending: Radiology | Admitting: Radiology

## 2020-09-01 DIAGNOSIS — Z4682 Encounter for fitting and adjustment of non-vascular catheter: Secondary | ICD-10-CM

## 2020-09-01 HISTORY — PX: IR RADIOLOGIST EVAL & MGMT: IMG5224
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
## 2020-09-08 ENCOUNTER — Ambulatory Visit: Payer: Self-pay | Admitting: General Surgery
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
## 2020-09-13 ENCOUNTER — Other Ambulatory Visit (HOSPITAL_COMMUNITY)
Admission: RE | Admit: 2020-09-13 | Discharge: 2020-09-13 | Disposition: A | Discharge status: Home / Self Care | Payer: 59 | Source: Ambulatory Visit | Attending: General Surgery | Admitting: General Surgery

## 2020-09-13 DIAGNOSIS — Z20822 Contact with and (suspected) exposure to covid-19: Secondary | ICD-10-CM | POA: Insufficient documentation

## 2020-09-13 DIAGNOSIS — Z01812 Encounter for preprocedural laboratory examination: Secondary | ICD-10-CM | POA: Insufficient documentation

## 2020-09-13 NOTE — Pre-Procedure Instructions (Signed)
Surgical Instructions:    Your procedure is scheduled on Thursday, May 19th (10:15 AM- 11:45 AM).  Report to [facility] Main Entrance "A" at 08:15 A.M., then check in with the Admitting office.  Call this number if you have any questions prior to, or have any problems the morning of surgery:  [phone]    Remember:  Do not eat after midnight the night before your surgery.  You may drink clear liquids until 07:15 AM the morning of your surgery.   Clear liquids allowed are: Water, Non-Citrus Juices (without pulp), Carbonated Beverages, Clear Tea, Black Coffee Only, and Gatorade.  Please complete your PRE-SURGERY 10 oz Bottled Water that was provided to you by 07:15 AM the morning of surgery.  Please, if able, drink it in one setting. DO NOT SIP.     Take these medicines the morning of surgery with A SIP OF WATER: allopurinol (ZYLOPRIM) Fenofibrate predniSONE (DELTASONE)  IF NEEDED: cyclobenzaprine (FLEXERIL) oxyCODONE-acetaminophen (PERCOCET)   >>DO NOT TAKE SYNJARDY XR THE DAY BEFORE OR THE MORNING OF SURGERY.<<   As of today, STOP taking any Aspirin (unless otherwise instructed by your surgeon) Aleve, Naproxen, Ibuprofen, Motrin, Advil, Goody's, BC's, all herbal medications, fish oil, and all vitamins.            HOW TO MANAGE YOUR DIABETES BEFORE AND AFTER SURGERY  Why is it important to control my blood sugar before and after surgery? . Improving blood sugar levels before and after surgery helps healing and can limit problems. . A way of improving blood sugar control is eating a healthy diet by: o  Eating less sugar and carbohydrates o  Increasing activity/exercise o  Talking with your doctor about reaching your blood sugar goals . High blood sugars (greater than 180 mg/dL) can raise your risk of infections and slow your recovery, so you will need to focus on controlling your diabetes during the weeks before surgery. . Make sure that the doctor who takes care of your  diabetes knows about your planned surgery including the date and location.  How do I manage my blood sugar before surgery? . Check your blood sugar at least 4 times a day, starting 2 days before surgery, to make sure that the level is not too high or low. . Check your blood sugar the morning of your surgery when you wake up and every 2 hours until you get to the Short Stay unit. o If your blood sugar is less than 70 mg/dL, you will need to treat for low blood sugar: - Treat a low blood sugar (less than 70 mg/dL) with  cup of clear juice (cranberry or apple), 4 glucose tablets, OR glucose gel. - Recheck blood sugar in 15 minutes after treatment (to make sure it is greater than 70 mg/dL). If your blood sugar is not greater than 70 mg/dL on recheck, call [phone] for further instructions. . Report your blood sugar to the short stay nurse when you get to Short Stay.  . If you are admitted to the hospital after surgery: o Your blood sugar will be checked by the staff and you will probably be given insulin after surgery (instead of oral diabetes medicines) to make sure you have good blood sugar levels. o The goal for blood sugar control after surgery is 80-180 mg/dL.    Special instructions:   [place]- Preparing For Surgery  Before surgery, you can play an important role. Because skin is not sterile, your skin needs to be as  free of germs as possible. You can reduce the number of germs on your skin by washing with CHG (chlorahexidine gluconate) Soap before surgery.  CHG is an antiseptic cleaner which kills germs and bonds with the skin to continue killing germs even after washing.    Oral Hygiene is also important to reduce your risk of infection.  Remember - BRUSH YOUR TEETH THE MORNING OF SURGERY WITH YOUR REGULAR TOOTHPASTE  Please do not use if you have an allergy to CHG or antibacterial soaps. If your skin becomes reddened/irritated stop using the CHG.  Do not shave (including legs  and underarms) for at least 48 hours prior to first CHG shower. It is OK to shave your face.  Please follow these instructions carefully.   1. Shower the NIGHT BEFORE SURGERY and the MORNING OF SURGERY  2. If you chose to wash your hair, wash your hair first as usual with your normal shampoo.  3. After you shampoo, rinse your hair and body thoroughly to remove the shampoo.  4. Wash Face and genitals (private parts) with your normal soap.   5. Use CHG Soap as you would any other liquid soap. You can apply CHG directly to the skin and wash gently with a scrungie or a clean washcloth.   6. Apply the CHG Soap to your body ONLY FROM THE NECK DOWN.  Do not use on open wounds or open sores. Avoid contact with your eyes, ears, mouth and genitals (private parts). Wash Face and genitals (private parts)  with your normal soap.   7. Wash thoroughly, paying special attention to the area where your surgery will be performed.  8. Thoroughly rinse your body with warm water from the neck down.  9. DO NOT shower/wash with your normal soap after using and rinsing off the CHG Soap.  10. Pat yourself dry with a CLEAN TOWEL.  11. Wear CLEAN PAJAMAS to bed the night before surgery.  12. Place CLEAN SHEETS on your bed the night before your surgery.  13. DO NOT SLEEP WITH PETS.   Day of Surgery: SHOWER with CHG soap. Brush your teeth WITH YOUR REGULAR TOOTHPASTE. Wear Clean/Comfortable clothing the morning of surgery. Do not apply any deodorants/lotions.   Do not wear jewelry, make up, or nail polish. Do not shave 48 hours prior to surgery.  Men may shave face and neck. Do not bring valuables to the hospital. [facility] is not responsible for any belongings or valuables.  If you use a CPAP at night, you may bring all equipment for your overnight stay.   Do NOT Smoke (Tobacco/Vaping) or drink Alcohol 24 hours prior to your procedure.   Contacts, glasses, or dentures may not be worn into surgery,  please bring cases for these belongings.   For patients admitted to the hospital, discharge time will be determined by your treatment team.   Patients discharged the day of surgery will not be allowed to drive home, and someone needs to stay with them for 24 hours.    Please read over the following fact sheets that you were given.

## 2020-09-14 ENCOUNTER — Encounter (HOSPITAL_COMMUNITY): Payer: Self-pay

## 2020-09-14 ENCOUNTER — Encounter (HOSPITAL_COMMUNITY)
Admission: RE | Admit: 2020-09-14 | Discharge: 2020-09-14 | Disposition: A | Discharge status: Home / Self Care | Payer: 59 | Source: Ambulatory Visit | Attending: General Surgery | Admitting: General Surgery

## 2020-09-14 ENCOUNTER — Other Ambulatory Visit: Payer: Self-pay

## 2020-09-14 HISTORY — DX: Unspecified osteoarthritis, unspecified site: M19.90

## 2020-09-14 LAB — BASIC METABOLIC PANEL
Anion gap: 9 (ref 5–15)
BUN: 11 mg/dL (ref 6–20)
CO2: 24 mmol/L (ref 22–32)
Calcium: 9.9 mg/dL (ref 8.9–10.3)
Chloride: 105 mmol/L (ref 98–111)
Creatinine, Ser: 0.77 mg/dL (ref 0.61–1.24)
GFR, Estimated: >60 mL/min (ref >60)
Glucose, Bld: 155 mg/dL — ABNORMAL HIGH (ref 70–99)
Potassium: 4 mmol/L (ref 3.5–5.1)
Sodium: 138 mmol/L (ref 135–145)

## 2020-09-14 LAB — CBC
HCT: 42.6 % (ref 39.0–52.0)
Hemoglobin: 13 g/dL (ref 13.0–17.0)
MCH: 28.5 pg (ref 26.0–34.0)
MCHC: 30.5 g/dL (ref 30.0–36.0)
MCV: 93.4 fL (ref 80.0–100.0)
Platelets: 631 10*3/uL — ABNORMAL HIGH (ref 150–400)
RBC: 4.56 MIL/uL (ref 4.22–5.81)
RDW: 13.8 % (ref 11.5–15.5)
WBC: 13.8 10*3/uL — ABNORMAL HIGH (ref 4.0–10.5)
nRBC: 0 % (ref 0.0–0.2)

## 2020-09-14 LAB — GLUCOSE, CAPILLARY: Glucose-Capillary: 155 mg/dL — ABNORMAL HIGH (ref 70–99)

## 2020-09-14 LAB — SARS CORONAVIRUS 2 (TAT 6-24 HRS): SARS Coronavirus 2: NEGATIVE

## 2020-09-14 NOTE — Progress Notes (Signed)
PCP: Dr. [name] Cardiologist: denies  EKG: 08/16/20--tachy, patient states he always had a fast heart rate. Today 118 upon arrival, manual 100 bpm, notified anesthesia CXR: n/a ECHO: 07-21-20 Stress Test: denies Cardiac Cath: denies  Fasting Blood Sugar- unknown--doesn't check CBG at home.  Last A1C on 07/27/20=7.4  ERAS: water bottle provided  Covid test yesterday, aware to quarantine: does have an MD appt tomorrow.  Patient denies shortness of breath, fever, cough, and chest pain at PAT appointment.  Patient verbalized understanding of instructions provided today at the PAT appointment.  Patient asked to review instructions at home and day of surgery.

## 2020-09-15 ENCOUNTER — Other Ambulatory Visit: Payer: 59

## 2020-09-16 ENCOUNTER — Inpatient Hospital Stay (HOSPITAL_COMMUNITY)
Admission: AD | Admit: 2020-09-16 | Discharge: 2020-09-19 | DRG: 415 | Disposition: A | Discharge status: Home / Self Care | Payer: 59 | Attending: General Surgery | Admitting: General Surgery

## 2020-09-16 ENCOUNTER — Encounter (HOSPITAL_COMMUNITY): Payer: Self-pay | Admitting: General Surgery

## 2020-09-16 ENCOUNTER — Ambulatory Visit (HOSPITAL_COMMUNITY): Payer: 59 | Admitting: Anesthesiology

## 2020-09-16 ENCOUNTER — Other Ambulatory Visit: Payer: Self-pay

## 2020-09-16 ENCOUNTER — Ambulatory Visit (HOSPITAL_COMMUNITY): Payer: 59 | Admitting: Physician Assistant

## 2020-09-16 ENCOUNTER — Encounter (HOSPITAL_COMMUNITY)
Admission: AD | Disposition: A | Discharge status: Home / Self Care | Payer: Self-pay | Source: Ambulatory Visit | Attending: General Surgery

## 2020-09-16 DIAGNOSIS — Z7952 Long term (current) use of systemic steroids: Secondary | ICD-10-CM

## 2020-09-16 DIAGNOSIS — Z20822 Contact with and (suspected) exposure to covid-19: Secondary | ICD-10-CM | POA: Diagnosis present

## 2020-09-16 DIAGNOSIS — Z79899 Other long term (current) drug therapy: Secondary | ICD-10-CM

## 2020-09-16 DIAGNOSIS — K660 Peritoneal adhesions (postprocedural) (postinfection): Secondary | ICD-10-CM | POA: Diagnosis present

## 2020-09-16 DIAGNOSIS — K851 Biliary acute pancreatitis without necrosis or infection: Principal | ICD-10-CM | POA: Diagnosis present

## 2020-09-16 DIAGNOSIS — K8001 Calculus of gallbladder with acute cholecystitis with obstruction: Secondary | ICD-10-CM | POA: Diagnosis present

## 2020-09-16 DIAGNOSIS — M1990 Unspecified osteoarthritis, unspecified site: Secondary | ICD-10-CM | POA: Diagnosis present

## 2020-09-16 DIAGNOSIS — Z7984 Long term (current) use of oral hypoglycemic drugs: Secondary | ICD-10-CM | POA: Diagnosis not present

## 2020-09-16 DIAGNOSIS — D649 Anemia, unspecified: Secondary | ICD-10-CM | POA: Diagnosis present

## 2020-09-16 DIAGNOSIS — E119 Type 2 diabetes mellitus without complications: Secondary | ICD-10-CM | POA: Diagnosis present

## 2020-09-16 DIAGNOSIS — Z9049 Acquired absence of other specified parts of digestive tract: Secondary | ICD-10-CM

## 2020-09-16 HISTORY — PX: LAPAROSCOPIC LYSIS OF ADHESIONS: SHX5905

## 2020-09-16 HISTORY — PX: CHOLECYSTECTOMY: SHX55

## 2020-09-16 LAB — GLUCOSE, CAPILLARY
Glucose-Capillary: 155 mg/dL — ABNORMAL HIGH (ref 70–99)
Glucose-Capillary: 125 mg/dL — ABNORMAL HIGH (ref 70–99)
Glucose-Capillary: 182 mg/dL — ABNORMAL HIGH (ref 70–99)
Glucose-Capillary: 161 mg/dL — ABNORMAL HIGH (ref 70–99)
Glucose-Capillary: 147 mg/dL — ABNORMAL HIGH (ref 70–99)

## 2020-09-16 SURGERY — LAPAROSCOPIC CHOLECYSTECTOMY WITH INTRAOPERATIVE CHOLANGIOGRAM
Anesthesia: General | Site: Abdomen

## 2020-09-16 MED ORDER — SUGAMMADEX SODIUM 200 MG/2ML IV SOLN
INTRAVENOUS | Status: DC | PRN
  Administered 2020-09-16: 100 mg via INTRAVENOUS
  Administered 2020-09-16: 200 mg via INTRAVENOUS

## 2020-09-16 MED ORDER — HYDROMORPHONE HCL 1 MG/ML IJ SOLN
INTRAMUSCULAR | Status: AC
  Administered 2020-09-16: 0.5 mg via INTRAVENOUS
  Filled 2020-09-16: qty 1

## 2020-09-16 MED ORDER — BUPIVACAINE HCL (PF) 0.25 % IJ SOLN
INTRAMUSCULAR | Status: AC
  Filled 2020-09-16: qty 30

## 2020-09-16 MED ORDER — MIDAZOLAM HCL 2 MG/2ML IJ SOLN
INTRAMUSCULAR | Status: AC
  Filled 2020-09-16: qty 2

## 2020-09-16 MED ORDER — ORAL CARE MOUTH RINSE: 15.0000 mL | Freq: Once | OROMUCOSAL | Status: AC

## 2020-09-16 MED ORDER — CHLORHEXIDINE GLUCONATE CLOTH 2 % EX PADS: 6.0000 | MEDICATED_PAD | Freq: Once | CUTANEOUS | Status: DC

## 2020-09-16 MED ORDER — SODIUM CHLORIDE 0.9 % IR SOLN
Status: DC | PRN
  Administered 2020-09-16: 1000 mL

## 2020-09-16 MED ORDER — ACETAMINOPHEN 500 MG PO TABS
1000.0000 mg | ORAL_TABLET | ORAL | Status: AC
  Administered 2020-09-16: 1000 mg via ORAL
  Filled 2020-09-16: qty 2

## 2020-09-16 MED ORDER — FENTANYL CITRATE (PF) 250 MCG/5ML IJ SOLN
INTRAMUSCULAR | Status: AC
  Filled 2020-09-16: qty 5

## 2020-09-16 MED ORDER — OXYCODONE HCL 5 MG PO TABS
ORAL_TABLET | ORAL | Status: AC
  Filled 2020-09-16: qty 2

## 2020-09-16 MED ORDER — ONDANSETRON HCL 4 MG/2ML IJ SOLN: 4.0000 mg | Freq: Four times a day (QID) | INTRAMUSCULAR | Status: DC | PRN

## 2020-09-16 MED ORDER — LACTATED RINGERS IV SOLN: INTRAVENOUS | Status: DC

## 2020-09-16 MED ORDER — 0.9 % SODIUM CHLORIDE (POUR BTL) OPTIME
TOPICAL | Status: DC | PRN
  Administered 2020-09-16: 1000 mL

## 2020-09-16 MED ORDER — METOPROLOL TARTRATE 5 MG/5ML IV SOLN: 5.0000 mg | Freq: Four times a day (QID) | INTRAVENOUS | Status: DC | PRN

## 2020-09-16 MED ORDER — PHENYLEPHRINE 40 MCG/ML (10ML) SYRINGE FOR IV PUSH (FOR BLOOD PRESSURE SUPPORT)
PREFILLED_SYRINGE | INTRAVENOUS | Status: DC | PRN
  Administered 2020-09-16: 120 ug via INTRAVENOUS
  Administered 2020-09-16: 40 ug via INTRAVENOUS
  Administered 2020-09-16: 80 ug via INTRAVENOUS

## 2020-09-16 MED ORDER — POTASSIUM CHLORIDE IN NACL 20-0.9 MEQ/L-% IV SOLN
INTRAVENOUS | Status: DC
  Filled 2020-09-16 (×3): qty 1000

## 2020-09-16 MED ORDER — GABAPENTIN 300 MG PO CAPS
300.0000 mg | ORAL_CAPSULE | ORAL | Status: DC
  Filled 2020-09-16: qty 1

## 2020-09-16 MED ORDER — ONDANSETRON 4 MG PO TBDP
4.0000 mg | ORAL_TABLET | Freq: Four times a day (QID) | ORAL | Status: DC | PRN
Start: 2020-09-16 — End: 2020-09-19

## 2020-09-16 MED ORDER — MIDAZOLAM HCL 2 MG/2ML IJ SOLN
INTRAMUSCULAR | Status: DC | PRN
  Administered 2020-09-16: 2 mg via INTRAVENOUS

## 2020-09-16 MED ORDER — FENTANYL CITRATE (PF) 100 MCG/2ML IJ SOLN
INTRAMUSCULAR | Status: DC | PRN
  Administered 2020-09-16 (×2): 50 ug via INTRAVENOUS
  Administered 2020-09-16: 100 ug via INTRAVENOUS
  Administered 2020-09-16: 50 ug via INTRAVENOUS

## 2020-09-16 MED ORDER — PROPOFOL 10 MG/ML IV BOLUS
INTRAVENOUS | Status: AC
  Filled 2020-09-16: qty 20

## 2020-09-16 MED ORDER — CEFAZOLIN SODIUM-DEXTROSE 2-4 GM/100ML-% IV SOLN
2.0000 g | INTRAVENOUS | Status: AC
  Administered 2020-09-16: 2 g via INTRAVENOUS
  Filled 2020-09-16: qty 100

## 2020-09-16 MED ORDER — ACETAMINOPHEN 500 MG PO TABS
1000.0000 mg | ORAL_TABLET | Freq: Four times a day (QID) | ORAL | Status: DC
  Administered 2020-09-16 – 2020-09-19 (×11): 1000 mg via ORAL
  Filled 2020-09-16 (×11): qty 2

## 2020-09-16 MED ORDER — METHOCARBAMOL 500 MG PO TABS
500.0000 mg | ORAL_TABLET | Freq: Four times a day (QID) | ORAL | Status: DC | PRN
  Administered 2020-09-17 – 2020-09-19 (×5): 500 mg via ORAL
  Filled 2020-09-16 (×6): qty 1

## 2020-09-16 MED ORDER — CELECOXIB 200 MG PO CAPS
400.0000 mg | ORAL_CAPSULE | ORAL | Status: AC
  Administered 2020-09-16: 400 mg via ORAL
  Filled 2020-09-16: qty 2

## 2020-09-16 MED ORDER — BUPIVACAINE HCL (PF) 0.25 % IJ SOLN
INTRAMUSCULAR | Status: DC | PRN
  Administered 2020-09-16: 14 mL

## 2020-09-16 MED ORDER — DIPHENHYDRAMINE HCL 25 MG PO CAPS
25.0000 mg | ORAL_CAPSULE | Freq: Four times a day (QID) | ORAL | Status: DC | PRN
Start: 2020-09-16 — End: 2020-09-19
  Administered 2020-09-19: 25 mg via ORAL
  Filled 2020-09-16: qty 1

## 2020-09-16 MED ORDER — PROPOFOL 10 MG/ML IV BOLUS
INTRAVENOUS | Status: DC | PRN
  Administered 2020-09-16: 160 mg via INTRAVENOUS

## 2020-09-16 MED ORDER — DIPHENHYDRAMINE HCL 50 MG/ML IJ SOLN: 25.0000 mg | Freq: Four times a day (QID) | INTRAMUSCULAR | Status: DC | PRN

## 2020-09-16 MED ORDER — ROCURONIUM BROMIDE 100 MG/10ML IV SOLN
INTRAVENOUS | Status: DC | PRN
  Administered 2020-09-16: 20 mg via INTRAVENOUS
  Administered 2020-09-16: 10 mg via INTRAVENOUS
  Administered 2020-09-16: 50 mg via INTRAVENOUS
  Administered 2020-09-16: 10 mg via INTRAVENOUS
  Administered 2020-09-16: 5 mg via INTRAVENOUS

## 2020-09-16 MED ORDER — ENSURE PRE-SURGERY PO LIQD: 296.0000 mL | Freq: Once | ORAL | Status: DC

## 2020-09-16 MED ORDER — OXYCODONE HCL 5 MG PO TABS
5.0000 mg | ORAL_TABLET | ORAL | Status: DC | PRN
  Administered 2020-09-16 – 2020-09-17 (×6): 10 mg via ORAL
  Administered 2020-09-17: 5 mg via ORAL
  Administered 2020-09-18 – 2020-09-19 (×8): 10 mg via ORAL
  Filled 2020-09-16 (×14): qty 2

## 2020-09-16 MED ORDER — CHLORHEXIDINE GLUCONATE 0.12 % MT SOLN
15.0000 mL | Freq: Once | OROMUCOSAL | Status: AC
  Administered 2020-09-16: 15 mL via OROMUCOSAL
  Filled 2020-09-16: qty 15

## 2020-09-16 MED ORDER — PIPERACILLIN-TAZOBACTAM 3.375 G IVPB
3.3750 g | Freq: Three times a day (TID) | INTRAVENOUS | Status: DC
  Administered 2020-09-16 – 2020-09-18 (×6): 3.375 g via INTRAVENOUS
  Filled 2020-09-16 (×6): qty 50

## 2020-09-16 MED ORDER — ONDANSETRON HCL 4 MG/2ML IJ SOLN
INTRAMUSCULAR | Status: AC
  Filled 2020-09-16: qty 2

## 2020-09-16 MED ORDER — FENTANYL CITRATE (PF) 100 MCG/2ML IJ SOLN
25.0000 ug | INTRAMUSCULAR | Status: DC | PRN
  Administered 2020-09-16 (×2): 50 ug via INTRAVENOUS

## 2020-09-16 MED ORDER — HYDROMORPHONE HCL 1 MG/ML IJ SOLN
0.5000 mg | INTRAMUSCULAR | Status: DC | PRN
  Administered 2020-09-16 – 2020-09-18 (×9): 1 mg via INTRAVENOUS
  Filled 2020-09-16 (×9): qty 1

## 2020-09-16 MED ORDER — ENOXAPARIN SODIUM 40 MG/0.4ML IJ SOSY
40.0000 mg | PREFILLED_SYRINGE | INTRAMUSCULAR | Status: DC
  Administered 2020-09-17 – 2020-09-19 (×3): 40 mg via SUBCUTANEOUS
  Filled 2020-09-16 (×3): qty 0.4

## 2020-09-16 MED ORDER — DEXAMETHASONE SODIUM PHOSPHATE 10 MG/ML IJ SOLN
INTRAMUSCULAR | Status: DC | PRN
  Administered 2020-09-16: 5 mg via INTRAVENOUS

## 2020-09-16 MED ORDER — HYDROMORPHONE HCL 1 MG/ML IJ SOLN
0.2500 mg | INTRAMUSCULAR | Status: DC | PRN
Start: 2020-09-16 — End: 2020-09-16
  Administered 2020-09-16 (×2): 0.25 mg via INTRAVENOUS

## 2020-09-16 MED ORDER — FENTANYL CITRATE (PF) 100 MCG/2ML IJ SOLN
INTRAMUSCULAR | Status: AC
  Filled 2020-09-16: qty 2

## 2020-09-16 SURGICAL SUPPLY — 59 items
APPLIER CLIP 5 13 M/L LIGAMAX5 (MISCELLANEOUS) ×2
BLADE CLIPPER SURG (BLADE) ×2 IMPLANT
BLADE SURG 10 STRL SS (BLADE) ×2 IMPLANT
CANISTER SUCT 3000ML PPV (MISCELLANEOUS) ×2 IMPLANT
CHLORAPREP W/TINT 26 (MISCELLANEOUS) ×2 IMPLANT
CLIP APPLIE 5 13 M/L LIGAMAX5 (MISCELLANEOUS) ×1 IMPLANT
COVER MAYO STAND STRL (DRAPES) ×2 IMPLANT
COVER SURGICAL LIGHT HANDLE (MISCELLANEOUS) ×2 IMPLANT
DERMABOND ADVANCED (GAUZE/BANDAGES/DRESSINGS) ×1
DERMABOND ADVANCED .7 DNX12 (GAUZE/BANDAGES/DRESSINGS) ×1 IMPLANT
DRAIN CHANNEL 19F RND (DRAIN) ×2 IMPLANT
DRAPE C-ARM 42X120 X-RAY (DRAPES) ×2 IMPLANT
DRSG OPSITE POSTOP 4X8 (GAUZE/BANDAGES/DRESSINGS) ×2 IMPLANT
DRSG TEGADERM 2-3/8X2-3/4 SM (GAUZE/BANDAGES/DRESSINGS) ×4 IMPLANT
DRSG TEGADERM 4X4.75 (GAUZE/BANDAGES/DRESSINGS) ×2 IMPLANT
ELECT BLADE 6.5 EXT (BLADE) ×2 IMPLANT
ELECT REM PT RETURN 9FT ADLT (ELECTROSURGICAL) ×2
ELECTRODE REM PT RTRN 9FT ADLT (ELECTROSURGICAL) ×1 IMPLANT
EVACUATOR SILICONE 100CC (DRAIN) ×2 IMPLANT
GAUZE SPONGE 2X2 8PLY STRL LF (GAUZE/BANDAGES/DRESSINGS) ×1 IMPLANT
GLOVE BIO SURGEON STRL SZ8 (GLOVE) ×2 IMPLANT
GLOVE SRG 8 PF TXTR STRL LF DI (GLOVE) ×1 IMPLANT
GLOVE SURG UNDER POLY LF SZ8 (GLOVE) ×2
GOWN STRL REUS W/ TWL LRG LVL3 (GOWN DISPOSABLE) ×2 IMPLANT
GOWN STRL REUS W/ TWL XL LVL3 (GOWN DISPOSABLE) ×1 IMPLANT
GOWN STRL REUS W/TWL LRG LVL3 (GOWN DISPOSABLE) ×4
GOWN STRL REUS W/TWL XL LVL3 (GOWN DISPOSABLE) ×2
KIT BASIN OR (CUSTOM PROCEDURE TRAY) ×2 IMPLANT
KIT TURNOVER KIT B (KITS) ×2 IMPLANT
L-HOOK LAP DISP 36CM (ELECTROSURGICAL) ×2
LHOOK LAP DISP 36CM (ELECTROSURGICAL) ×1 IMPLANT
NEEDLE 22X1 1/2 (OR ONLY) (NEEDLE) ×2 IMPLANT
NS IRRIG 1000ML POUR BTL (IV SOLUTION) ×2 IMPLANT
PAD ARMBOARD 7.5X6 YLW CONV (MISCELLANEOUS) ×2 IMPLANT
PENCIL BUTTON HOLSTER BLD 10FT (ELECTRODE) ×2 IMPLANT
POUCH RETRIEVAL ECOSAC 10 (ENDOMECHANICALS) ×1 IMPLANT
POUCH RETRIEVAL ECOSAC 10MM (ENDOMECHANICALS) ×2
SCISSORS LAP 5X35 DISP (ENDOMECHANICALS) ×2 IMPLANT
SET CHOLANGIOGRAPH 5 50 .035 (SET/KITS/TRAYS/PACK) ×2 IMPLANT
SET IRRIG TUBING LAPAROSCOPIC (IRRIGATION / IRRIGATOR) ×2 IMPLANT
SET TUBE SMOKE EVAC HIGH FLOW (TUBING) ×2 IMPLANT
SLEEVE ENDOPATH XCEL 5M (ENDOMECHANICALS) ×4 IMPLANT
SPECIMEN JAR SMALL (MISCELLANEOUS) ×2 IMPLANT
SPONGE GAUZE 2X2 STER 10/PKG (GAUZE/BANDAGES/DRESSINGS) ×1
SPONGE LAP 18X18 RF (DISPOSABLE) ×2 IMPLANT
STAPLER VISISTAT 35W (STAPLE) ×2 IMPLANT
SUT ETHILON 2 0 FS 18 (SUTURE) ×2 IMPLANT
SUT SILK 2 0SH CR/8 30 (SUTURE) ×2 IMPLANT
SUT SILK 3 0SH CR/8 30 (SUTURE) ×2 IMPLANT
SUT VIC AB 4-0 PS2 27 (SUTURE) ×2 IMPLANT
SYR BULB IRRIG 60ML STRL (SYRINGE) ×2 IMPLANT
TOWEL GREEN STERILE (TOWEL DISPOSABLE) ×2 IMPLANT
TOWEL GREEN STERILE FF (TOWEL DISPOSABLE) ×2 IMPLANT
TRAY LAPAROSCOPIC MC (CUSTOM PROCEDURE TRAY) ×2 IMPLANT
TROCAR XCEL BLUNT TIP 100MML (ENDOMECHANICALS) ×2 IMPLANT
TROCAR XCEL NON-BLD 5MMX100MML (ENDOMECHANICALS) ×2 IMPLANT
TUBE CONNECTING 20X1/4 (TUBING) ×2 IMPLANT
WATER STERILE IRR 1000ML POUR (IV SOLUTION) ×2 IMPLANT
YANKAUER SUCT BULB TIP NO VENT (SUCTIONS) ×2 IMPLANT

## 2020-09-16 NOTE — Anesthesia Preprocedure Evaluation (Addendum)
Anesthesia Evaluation  Patient identified by MRN, date of birth, ID band Patient awake    Reviewed: Allergy & Precautions, NPO status , Patient's Chart, lab work & pertinent test results  Airway Mallampati: II  TM Distance: >3 FB     Dental   Pulmonary neg pulmonary ROS,    breath sounds clear to auscultation       Cardiovascular negative cardio ROS   Rhythm:Regular Rate:Normal     Neuro/Psych negative neurological ROS     GI/Hepatic Neg liver ROS, History noted CG   Endo/Other  diabetes  Renal/GU Renal disease     Musculoskeletal  (+) Arthritis ,   Abdominal   Peds  Hematology  (+) anemia ,   Anesthesia Other Findings   Reproductive/Obstetrics                             Anesthesia Physical Anesthesia Plan  ASA: III  Anesthesia Plan: General   Post-op Pain Management:    Induction: Intravenous  PONV Risk Score and Plan: 2 and Ondansetron and Midazolam  Airway Management Planned:   Additional Equipment:   Intra-op Plan:   Post-operative Plan: Extubation in OR  Informed Consent: I have reviewed the patients History and Physical, chart, labs and discussed the procedure including the risks, benefits and alternatives for the proposed anesthesia with the patient or authorized representative who has indicated his/her understanding and acceptance.     Dental advisory given  Plan Discussed with: CRNA and Anesthesiologist  Anesthesia Plan Comments:         Anesthesia Quick Evaluation

## 2020-09-16 NOTE — Anesthesia Postprocedure Evaluation (Signed)
Anesthesia Post Note  Patient: Billy Cowan  Procedure(s) Performed: ATEMPTED LAPAROSCOPIC CHOLECYSTECTOMY (N/A Abdomen) LAPAROSCOPIC LYSIS OF ADHESIONS (N/A Abdomen) OPEN SUBTOTAL CHOLECYSTECTOMY, REMOVAL OF PERCUTANEOUS DRAIN (N/A Abdomen)     Patient location during evaluation: PACU Anesthesia Type: Billy Level of consciousness: awake and alert Pain management: pain level controlled Vital Signs Assessment: post-procedure vital signs reviewed and stable Respiratory status: spontaneous breathing, nonlabored ventilation and respiratory function stable Cardiovascular status: blood pressure returned to baseline and stable Postop Assessment: no apparent nausea or vomiting Anesthetic complications: no   No complications documented.  Last Vitals:  Vitals:   09/16/20 1525 09/16/20 1540  BP: 116/81 117/85  Pulse: (!) 107 (!) 108  Resp: 11 10  Temp:    SpO2: 100% 100%    Last Pain:  Vitals:   09/16/20 1525  TempSrc:   PainSc: Asleep                 Katonya Blecher,W. EDMOND

## 2020-09-16 NOTE — Op Note (Signed)
09/16/2020  1:43 PM  PATIENT:  Billy Cowan  55 y.o. male  PRE-OPERATIVE DIAGNOSIS:  BILIARY PANCREATITIS  POST-OPERATIVE DIAGNOSIS:  BILIARY PANCREATITIS  PROCEDURE:  Procedure(s): LAPAROSCOPIC LYSIS OF ADHESIONS OPEN SUBTOTAL CHOLECYSTECTOMY REMOVAL OF PERCUTANEOUS DRAIN  SURGEON:  Violeta Gelinas, MD  ASSISTANTS: Manus Rudd, MD  ANESTHESIA:   general  EBL:  Total I/O In: 1000 [I.V.:1000] Out: 200 [Blood:200]  BLOOD ADMINISTERED:none  DRAINS: (1) Jackson-Pratt drain(s) with closed bulb suction in the RUQ   SPECIMEN:  Excision  DISPOSITION OF SPECIMEN:  PATHOLOGY  COUNTS:  YES  DICTATION: .Dragon Dictation Findings: Extensive intra-abdominal adhesions, gallbladder filled with pus, infection present at time of surgery Upon entering the abdomen (organ space), I encountered purulence in the gallbladder.  CASE DATA:  Type of patient?: Elective [facility] Private Case  Status of Case? Elective Scheduled  Infection Present At Time Of Surgery (PATOS)?  PURULENCE at the gallbladder   Procedure in detail: Informed consent was obtained.  He received intravenous antibiotics.  He was brought to the operating room and general endotracheal anesthesia was administered by the anesthesia staff.  His abdomen was prepped and draped in a sterile fashion.  Timeout procedure was performed.  In light of his previous midline abdominal surgery local was injected in the upper midline below his xiphoid.  Incision was made.  Subcutaneous tissues were dissected down and I bluntly entered the abdominal cavity.  Pursestring of 0 Vicryl was placed around the fascial opening and Hassan trocar was inserted.  The abdomen was insufflated with carbon dioxide.  Once we inserted the camera, we could see that we will below the omentum due to a lot of significant adhesions.  We elected to then place a 5 mm right upper quadrant port with an Optiview approach.  This was done without difficulty or  complication.  There were noted to be significant adhesions in the abdomen from previous surgery.  We visualized an area of the right lower quadrant to place an additional 5 mm port.  I began with lysis of adhesions very carefully taking down omentum and some small bowel loops that were stuck up on the anterior abdominal wall.  This cleared away enough space that we placed a lower midline 5 mm port below the umbilicus.  This facilitated continued lysis of adhesions taking down the omentum from the abdominal wall we eventually worked her way up to the right upper quadrant and visualize the edge of the liver.  We were able to come across and identify the gallbladder.  We carefully dissected the transverse colon down off of the gallbladder.  The gallbladder was very tensely distended and we could not retract it whatsoever.  We cleared more adhesions away medially and were not able to make further progress of dissection.  Again the gallbladder was very distended.  Decision was made to convert to open procedure.  I made a right upper quadrant incision and subcutaneous tissues were taken down using Bovie cautery.  The fascia was divided with cautery layer by layer achieving good hemostasis.  The peritoneal cavity was entered.  We then were able to visualize the gallbladder which was very inflamed.  After some further gentle dissection ruptured revealing it was full of pus.  This was all evacuated out thoroughly.  The significant amount of inflammation and adhesions precluded safe dissection of the infundibulum and area where the cystic duct was located.  In order to further identify and abdomen, I made a entry of the gallbladder using  cautery.  This confirmed we were unable to safely dissect down by the infundibulum.  Decision was made to do a symptomatic cholecystectomy.  I removed the anterior portion of of the gallbladder wall using cautery.  It was very hyperemic and we used cautery and several stitches to get good  hemostasis along the wall.  The removed-subtotal cholecystectomy specimen was sent to pathology.  We then copiously irrigated the area and ensured hemostasis.  We placed a 69 [place] Blake drain through the right lower quadrant port site and placed it over the area of the gallbladder.  We further copiously irrigated the abdomen.  We also carefully inspected the areas of previous dissection and there were no complications we ensured hemostasis.  We removed the hassan trochar from the upper midline and tied the pursestring to close the fascia.  Next the right upper quadrant fascia was closed in 2 layers with the deep fascia being closed with a running #1 PDS and the more superficial layers currently closed by running PDS. Subcutaneous tissues were irrigated and the skin of the right upper quadrant incision and the 2 remaining port sites were closed with staples.  Sterile dressings were applied.  All counts were correct.  He tolerated the procedure without apparent complication was taken recovery in stable condition.   PATIENT DISPOSITION:  PACU - hemodynamically stable.   Delay start of Pharmacological VTE agent (>24hrs) due to surgical blood loss or risk of bleeding:  no  Violeta Gelinas, MD, MPH, FACS Pager: [phone]  5/19/20221:43 PM

## 2020-09-16 NOTE — Anesthesia Procedure Notes (Signed)
Procedure Name: Intubation Date/Time: 09/16/2020 11:17 AM Performed by: Algis Greenhouse, CRNA Pre-anesthesia Checklist: Patient identified, Patient being monitored, Timeout performed, Emergency Drugs available and Suction available Patient Re-evaluated:Patient Re-evaluated prior to induction Oxygen Delivery Method: Circle System Utilized Preoxygenation: Pre-oxygenation with 100% oxygen Induction Type: IV induction Ventilation: Mask ventilation without difficulty Laryngoscope Size: Miller and 2 Grade View: Grade I Tube type: Oral Tube size: 7.0 mm Number of attempts: 1 Airway Equipment and Method: stylet Placement Confirmation: ETT inserted through vocal cords under direct vision,  positive ETCO2 and breath sounds checked- equal and bilateral Secured at: 21 cm Tube secured with: Tape Dental Injury: Teeth and Oropharynx as per pre-operative assessment

## 2020-09-16 NOTE — Transfer of Care (Signed)
Immediate Anesthesia Transfer of Care Note  Patient: Billy Cowan  Procedure(s) Performed: ATEMPTED LAPAROSCOPIC CHOLECYSTECTOMY (N/A Abdomen) INTRAOPERATIVE CHOLANGIOGRAM (N/A Abdomen) LYSIS OF ADHESION (N/A Abdomen) OPEN CHOLECYSTECTOMY, REMOVAL OF PERCUTANEOUS DRAIN (N/A Abdomen)  Patient Location: PACU  Anesthesia Type:General  Level of Consciousness: drowsy  Airway & Oxygen Therapy: Patient Spontanous Breathing and Patient connected to nasal cannula oxygen  Post-op Assessment: Report given to RN  Post vital signs: Reviewed and stable  Last Vitals:  Vitals Value Taken Time  BP 124/87 09/16/20 1354  Temp    Pulse 107 09/16/20 1355  Resp 10 09/16/20 1355  SpO2 98 % 09/16/20 1355  Vitals shown include unvalidated device data.  Last Pain:  Vitals:   09/16/20 0822  TempSrc:   PainSc: 0-No pain      Patients Stated Pain Goal: 3 (09/16/20 4142)  Complications: No complications documented.

## 2020-09-16 NOTE — Progress Notes (Signed)
Called Dr. Chilton Si and made her aware that patient's heart rate was 128 upon arrival to pre-op this morning and recheck heart rate was 115. BP 130/89. No new orders received at this time.

## 2020-09-16 NOTE — Interval H&P Note (Signed)
History and Physical Interval Note:  09/16/2020 9:58 AM  Billy Cowan  has presented today for surgery, with the diagnosis of BILIARY PANCREATITIS.  The various methods of treatment have been discussed with the patient and family. After consideration of risks, benefits and other options for treatment, the patient has consented to  Procedure(s): LAPAROSCOPIC CHOLECYSTECTOMY WITH INTRAOPERATIVE CHOLANGIOGRAM (N/A) as a surgical intervention.  The patient's history has been reviewed, patient examined, no change in status, stable for surgery.  I have reviewed the patient's chart and labs.  Questions were answered to the patient's satisfaction.     Liz Malady

## 2020-09-16 NOTE — Plan of Care (Signed)

## 2020-09-17 ENCOUNTER — Encounter (HOSPITAL_COMMUNITY): Payer: Self-pay | Admitting: General Surgery

## 2020-09-17 LAB — CBC
HCT: 34.4 % — ABNORMAL LOW (ref 39.0–52.0)
Hemoglobin: 10.7 g/dL — ABNORMAL LOW (ref 13.0–17.0)
MCH: 28.8 pg (ref 26.0–34.0)
MCHC: 31.1 g/dL (ref 30.0–36.0)
MCV: 92.7 fL (ref 80.0–100.0)
Platelets: 499 10*3/uL — ABNORMAL HIGH (ref 150–400)
RBC: 3.71 MIL/uL — ABNORMAL LOW (ref 4.22–5.81)
RDW: 13.3 % (ref 11.5–15.5)
WBC: 23.2 10*3/uL — ABNORMAL HIGH (ref 4.0–10.5)
nRBC: 0 % (ref 0.0–0.2)

## 2020-09-17 LAB — BASIC METABOLIC PANEL
Anion gap: 8 (ref 5–15)
BUN: 13 mg/dL (ref 6–20)
CO2: 21 mmol/L — ABNORMAL LOW (ref 22–32)
Calcium: 9.2 mg/dL (ref 8.9–10.3)
Chloride: 103 mmol/L (ref 98–111)
Creatinine, Ser: 0.79 mg/dL (ref 0.61–1.24)
GFR, Estimated: >60 mL/min (ref >60)
Glucose, Bld: 184 mg/dL — ABNORMAL HIGH (ref 70–99)
Potassium: 4.6 mmol/L (ref 3.5–5.1)
Sodium: 132 mmol/L — ABNORMAL LOW (ref 135–145)

## 2020-09-17 LAB — GLUCOSE, CAPILLARY: Glucose-Capillary: 154 mg/dL — ABNORMAL HIGH (ref 70–99)

## 2020-09-17 LAB — SURGICAL PATHOLOGY

## 2020-09-17 NOTE — Progress Notes (Signed)
1 Day Post-Op   Subjective/Chief Complaint: Tolerated clears, good pain control, passed gas   Objective: Vital signs in last 24 hours: Temp:  [97.7 F (36.5 C)-98.8 F (37.1 C)] 98 F (36.7 C) (05/20 0600) Pulse Rate:  [86-128] 86 (05/20 0600) Resp:  [8-18] 18 (05/20 0600) BP: (97-130)/(69-89) 97/69 (05/20 0600) SpO2:  [96 %-100 %] 97 % (05/20 0600) Weight:  [81.2 kg] 81.2 kg (05/19 0810)    Intake/Output from previous day: 05/19 0701 - 05/20 0700 In: 2752.1 [P.O.:710; I.V.:1991; IV Piggyback:1.1] Out: 1310 [Urine:1050; Drains:60; Blood:200] Intake/Output this shift: No intake/output data recorded.  General appearance: cooperative Resp: clear to auscultation bilaterally Cardio: regular rate and rhythm GI: soft, incisiond dressed, JP SS Extremities: calves soft  Lab Results:  Recent Labs    09/14/20 1000 09/17/20 0222  WBC 13.8* 23.2*  HGB 13.0 10.7*  HCT 42.6 34.4*  PLT 631* 499*   BMET Recent Labs    09/14/20 1000 09/17/20 0222  NA 138 132*  K 4.0 4.6  CL 105 103  CO2 24 21*  GLUCOSE 155* 184*  BUN 11 13  CREATININE 0.77 0.79  CALCIUM 9.9 9.2   PT/INR No results for input(s): LABPROT, INR in the last 72 hours. ABG No results for input(s): PHART, HCO3 in the last 72 hours.  Invalid input(s): PCO2, PO2  Studies/Results: No results found.  Anti-infectives: Anti-infectives (From admission, onward)   Start     Dose/Rate Route Frequency Ordered Stop   09/16/20 1715  piperacillin-tazobactam (ZOSYN) IVPB 3.375 g        3.375 g 12.5 mL/hr over 240 Minutes Intravenous Every 8 hours 09/16/20 1615     09/16/20 0815  ceFAZolin (ANCEF) IVPB 2g/100 mL premix        2 g 200 mL/hr over 30 Minutes Intravenous On call to O.R. 09/16/20 0808 09/16/20 1101      Assessment/Plan: s/p Laparoscopic LOA and open subtotal cholecystectomy 5/19 - Empyema of the GB - Zosyn - adv to fulls - mobilize - watch drain output, SS now. I D/W him if he drains bile he may  need another ERCP by Dr. Elnoria Howard.  VTE - LMWH FEN - above, lytes OK, IVF to 50/h  LOS: 1 day    Liz Malady 09/17/2020

## 2020-09-17 NOTE — Progress Notes (Signed)
Patient ID: Billy Cowan, male   DOB: [birth year], 55 y.o.   MRN: [id number] Tolerating fulls but a little bloated. Good pain control. JP SS. I also spoke wit his wife.  Violeta Gelinas, MD, MPH, FACS Please use [email or web address] to contact on call provider

## 2020-09-18 LAB — CBC
HCT: 31.9 % — ABNORMAL LOW (ref 39.0–52.0)
Hemoglobin: 10 g/dL — ABNORMAL LOW (ref 13.0–17.0)
MCH: 29 pg (ref 26.0–34.0)
MCHC: 31.3 g/dL (ref 30.0–36.0)
MCV: 92.5 fL (ref 80.0–100.0)
Platelets: 530 10*3/uL — ABNORMAL HIGH (ref 150–400)
RBC: 3.45 MIL/uL — ABNORMAL LOW (ref 4.22–5.81)
RDW: 13.7 % (ref 11.5–15.5)
WBC: 14.8 10*3/uL — ABNORMAL HIGH (ref 4.0–10.5)
nRBC: 0 % (ref 0.0–0.2)

## 2020-09-18 LAB — BASIC METABOLIC PANEL
Anion gap: 8 (ref 5–15)
BUN: 9 mg/dL (ref 6–20)
CO2: 24 mmol/L (ref 22–32)
Calcium: 9.3 mg/dL (ref 8.9–10.3)
Chloride: 104 mmol/L (ref 98–111)
Creatinine, Ser: 0.71 mg/dL (ref 0.61–1.24)
GFR, Estimated: >60 mL/min (ref >60)
Glucose, Bld: 122 mg/dL — ABNORMAL HIGH (ref 70–99)
Potassium: 4.5 mmol/L (ref 3.5–5.1)
Sodium: 136 mmol/L (ref 135–145)

## 2020-09-18 MED ORDER — AMOXICILLIN-POT CLAVULANATE 875-125 MG PO TABS
1.0000 | ORAL_TABLET | Freq: Two times a day (BID) | ORAL | Status: DC
  Administered 2020-09-18 – 2020-09-19 (×3): 1 via ORAL
  Filled 2020-09-18 (×3): qty 1

## 2020-09-18 NOTE — Progress Notes (Signed)
2 Days Post-Op   Subjective/Chief Complaint: Tolerated fulls, good pain control, passed gas   Objective: Vital signs in last 24 hours: Temp:  [97.9 F (36.6 C)-98.3 F (36.8 C)] 98 F (36.7 C) (05/21 0508) Pulse Rate:  [84-92] 92 (05/21 0508) Resp:  [16-18] 18 (05/21 0508) BP: (99-111)/(72-83) 111/83 (05/21 0508) SpO2:  [98 %-99 %] 99 % (05/21 0508)    Intake/Output from previous day: 05/20 0701 - 05/21 0700 In: 882.6 [P.O.:240; I.V.:642.6] Out: 1330 [Urine:1250; Drains:80] Intake/Output this shift: No intake/output data recorded.  General appearance: cooperative GI: soft, JP SS Incisions:CDI  Lab Results:  Recent Labs    09/17/20 0222 09/18/20 0216  WBC 23.2* 14.8*  HGB 10.7* 10.0*  HCT 34.4* 31.9*  PLT 499* 530*   BMET Recent Labs    09/17/20 0222 09/18/20 0216  NA 132* 136  K 4.6 4.5  CL 103 104  CO2 21* 24  GLUCOSE 184* 122*  BUN 13 9  CREATININE 0.79 0.71  CALCIUM 9.2 9.3   PT/INR No results for input(s): LABPROT, INR in the last 72 hours. ABG No results for input(s): PHART, HCO3 in the last 72 hours.  Invalid input(s): PCO2, PO2  Studies/Results: No results found.  Anti-infectives: Anti-infectives (From admission, onward)   Start     Dose/Rate Route Frequency Ordered Stop   09/16/20 1715  piperacillin-tazobactam (ZOSYN) IVPB 3.375 g  Status:  Discontinued        3.375 g 12.5 mL/hr over 240 Minutes Intravenous Every 8 hours 09/16/20 1615 09/18/20 0811   09/16/20 0815  ceFAZolin (ANCEF) IVPB 2g/100 mL premix        2 g 200 mL/hr over 30 Minutes Intravenous On call to O.R. 09/16/20 0808 09/16/20 1101      Assessment/Plan: s/p Laparoscopic LOA and open subtotal cholecystectomy 5/19 - Empyema of the GB - Zosyn >> Augmentin - adv to reg diet - mobilize - watch drain output, SS now. I D/W him if he drains bile he may need another ERCP by Dr. Elnoria Howard.  VTE - LMWH FEN - above, lytes OK,SL IVF  LOS: 2 days    Vanita Panda 09/18/2020

## 2020-09-19 MED ORDER — OXYCODONE HCL 5 MG PO TABS: 5.0000 mg | ORAL_TABLET | Freq: Four times a day (QID) | ORAL | 0 refills | Status: AC | PRN

## 2020-09-19 NOTE — Discharge Instructions (Signed)
LAPAROSCOPIC SURGERY: POST OP INSTRUCTIONS  1. DIET: Follow a light bland diet the first 24 hours after arrival home, such as soup, liquids, crackers, etc.  Be sure to include lots of fluids daily.  Avoid fast food or heavy meals as your are more likely to get nauseated.  Eat a low fat the next few days after surgery.   2. Take your usually prescribed home medications unless otherwise directed. 3. PAIN CONTROL: a. Pain is best controlled by a usual combination of three different methods TOGETHER: i. Ice/Heat ii. Over the counter pain medication iii. Prescription pain medication b. Most patients will experience some swelling and bruising around the incisions.  Ice packs or heating pads (30-60 minutes up to 6 times a day) will help. Use ice for the first few days to help decrease swelling and bruising, then switch to heat to help relax tight/sore spots and speed recovery.  Some people prefer to use ice alone, heat alone, alternating between ice & heat.  Experiment to what works for you.  Swelling and bruising can take several weeks to resolve.   c. It is helpful to take an over-the-counter pain medication regularly for the first few weeks.  Choose one of the following that works best for you: i. Naproxen (Aleve, etc)  Two 220mg  tabs twice a day ii. Ibuprofen (Advil, etc) Three 200mg  tabs four times a day (every meal & bedtime) d. A  prescription for pain medication (such as percocet, vicodin, oxycodone, hydrocodone, etc) should be given to you upon discharge.  Take your pain medication as prescribed.  i. If you are having problems/concerns with the prescription medicine (does not control pain, nausea, vomiting, rash, itching, etc), please call [phone] to see if we need to switch you to a different pain medicine that will work better for you and/or control your side effect better. ii. If you need a refill on your pain medication, please contact your pharmacy.  They will contact our office to  request authorization. Prescriptions will not be filled after 5 pm or on week-ends.   4. Avoid getting constipated.  Between the surgery and the pain medications, it is common to experience some constipation.  Increasing fluid intake and taking a fiber supplement (such as Metamucil, Citrucel, FiberCon, MiraLax, etc) 1-2 times a day regularly will usually help prevent this problem from occurring.  A mild laxative (prune juice, Milk of Magnesia, MiraLax, etc) should be taken according to package directions if there are no bowel movements after 48 hours.   5. Watch out for diarrhea.  If you have many loose bowel movements, simplify your diet to bland foods & liquids for a few days.  Stop any stool softeners and decrease your fiber supplement.  Switching to mild anti-diarrheal medications (Kayopectate, Pepto Bismol) can help.  If this worsens or does not improve, please call us. 6. Wash / shower every day.  You may shower over the dressings as they are waterproof.  Continue to shower over incision(s) after the dressing is off. 7.   You may leave the incision open to air.  You may replace a dressing/Band-Aid to cover the incision for comfort if you wish.  8. Empty drain daily.  Cover with a dry dressing.   9. ACTIVITIES as tolerated:   a. You may resume regular (light) daily activities beginning the next day--such as daily self-care, walking, climbing stairs--gradually increasing activities as tolerated.  If you can walk 30 minutes without difficulty, it is safe to try  more intense activity such as jogging, treadmill, bicycling, low-impact aerobics, swimming, etc. b. Save the most intensive and strenuous activity for last such as sit-ups, heavy lifting, contact sports, etc  Refrain from any heavy lifting or straining until you are off narcotics for pain control.   c. DO NOT PUSH THROUGH PAIN.  Let pain be your guide: If it hurts to do something, don't do it.  Pain is your body warning you to avoid that  activity for another week until the pain goes down. d. You may drive when you are no longer taking prescription pain medication, you can comfortably wear a seatbelt, and you can safely maneuver your car and apply brakes. e. Bonita Quin may have sexual intercourse when it is comfortable.  10. FOLLOW UP in our office a. Please call [facility] at [phone] to set up an appointment to see your surgeon in the office for a follow-up appointment approximately 2-3 weeks after your surgery. b. Make sure that you call for this appointment the day you arrive home to insure a convenient appointment time. 10. IF YOU HAVE DISABILITY OR FAMILY LEAVE FORMS, BRING THEM TO THE OFFICE FOR PROCESSING.  DO NOT GIVE THEM TO YOUR DOCTOR.   WHEN TO CALL us [phone]: 1. Poor pain control 2. Reactions / problems with new medications (rash/itching, nausea, etc)  3. Fever over 101.5 F (38.5 C) 4. Inability to urinate 5. Nausea and/or vomiting 6. Worsening swelling or bruising 7. Continued bleeding from incision. 8. Increased pain, redness, or drainage from the incision   The clinic staff is available to answer your questions during regular business hours (8:30am-5pm).  Please don't hesitate to call and ask to speak to one of our nurses for clinical concerns.   If you have a medical emergency, go to the nearest emergency room or call 911.  A surgeon from [facility] is always on call at the [facility], [place] [address], [address], [place], [place]  [address] ? MAIN: [phone] ? TOLL FREE: [phone] ?  FAX [phone] [email or web address]

## 2020-09-19 NOTE — Progress Notes (Signed)
Billy Cowan to be D/C'd  per MD order. Discussed with the patient and all questions fully answered.  VSS, Skin clean, dry and intact without evidence of skin break down, no evidence of skin tears noted.Marland Kitchen  An After Visit Summary was printed and given to the patient.   D/c education completed with patient/family including follow up instructions, medication list, d/c activities limitations if indicated, with other d/c instructions as indicated by MD - patient able to verbalize understanding, all questions fully answered.   Patient instructed to return to ED, call 911, or call MD for any changes in condition.   Patient walked out with wife, and D/C home via private auto.

## 2020-09-19 NOTE — Discharge Summary (Signed)
Physician Discharge Summary  Patient ID: Billy Cowan MRN: [id number] DOB/AGE: 54/29/1967 56 y.o.  Admit date: 09/16/2020 Discharge date: 09/19/2020  Admission Diagnoses: gallstone pancreatitis  Discharge Diagnoses:  Active Problems:   S/P cholecystectomy   Discharged Condition: good  Hospital Course: Pt admitted to floor after lap converted to open chole.  His diet was advanced as tolerated.  He was discharged to home once his pain was controlled with PO meds.  He will continue his surgical drain at home.   Consults: None  Significant Diagnostic Studies: labs: cbc, bmet  Treatments: analgesia: acetaminophen and surgery: see above  Discharge Exam: Blood pressure 129/85, pulse 96, temperature 97.6 F (36.4 C), temperature source Oral, resp. rate 18, height $RemoveBe'5\' 10"'EYRrqigwq$  (1.778 m), weight 81.2 kg, SpO2 99 %. General appearance: alert and cooperative GI: normal findings: soft, appropriately tender Incision/Wound: clean, dry, intact JP: SSF  Disposition: Discharge disposition: 01-Home or Self Care        Allergies as of 09/19/2020   No Known Allergies     Medication List    STOP taking these medications   BD PosiFlush 0.9 % Soln injection Generic drug: sodium chloride flush   ferrous sulfate 325 (65 FE) MG tablet   oxyCODONE-acetaminophen 10-325 MG tablet Commonly known as: PERCOCET   polyethylene glycol 17 g packet Commonly known as: MIRALAX / GLYCOLAX   sodium chloride 0.9 % injection     TAKE these medications   allopurinol 100 MG tablet Commonly known as: ZYLOPRIM Take 100 mg by mouth daily.   blood glucose meter kit and supplies Dispense based on patient and insurance preference. Use up to four times daily as directed. (FOR ICD-10 E10.9, E11.9).   cyclobenzaprine 10 MG tablet Commonly known as: FLEXERIL Take 10 mg by mouth daily as needed for pain.   docusate sodium 100 MG capsule Commonly known as: COLACE Take 1 capsule (100 mg total) by mouth 2  (two) times daily as needed for mild constipation.   fenofibrate 160 MG tablet Take 160 mg by mouth daily.   Fish Oil 1000 MG Caps Take 1,000 mg by mouth daily.   oxyCODONE 5 MG immediate release tablet Commonly known as: Oxy IR/ROXICODONE Take 1-2 tablets (5-10 mg total) by mouth every 6 (six) hours as needed for moderate pain.   predniSONE 5 MG tablet Commonly known as: DELTASONE Take 5 mg by mouth daily with breakfast.   Synjardy XR 12.08-998 MG Tb24 Generic drug: Empagliflozin-metFORMIN HCl ER Take 2 tablets by mouth every morning.       Follow-up Information    Georganna Skeans, MD. Go on 10/05/2020.   Specialty: General Surgery Contact information: [place]  [phone]               Signed: Rosario Adie 5/95/6387, 8:15 AM

## 2020-09-24 ENCOUNTER — Other Ambulatory Visit (HOSPITAL_COMMUNITY): Payer: 59

## 2022-09-07 IMAGING — RF DG SINUS / FISTULA TRACT / ABSCESSOGRAM
6 series · 14 of 19 positions shown · non-contrast
Comparison: none

INDICATION: Status post percutaneous cholecystostomy tube placement on
07/22/2020. Assessment of bile duct patency.

[Series 1: one shot · 1 of 1 slices shown (1 of 3)]
[im 1/1]
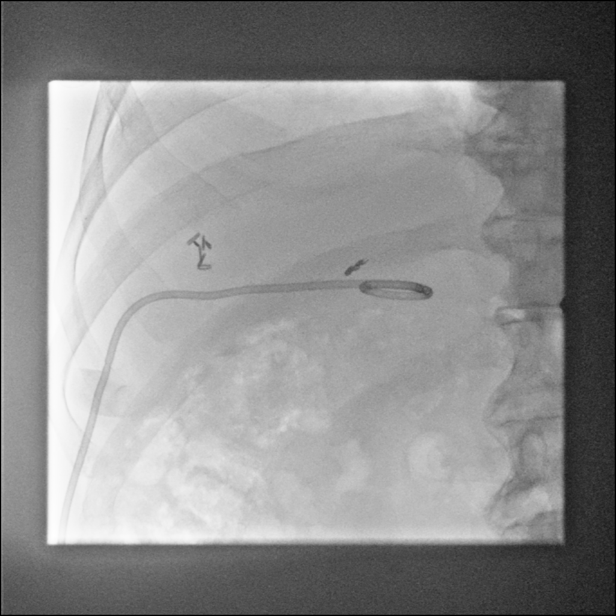

[Series 2: sequence · 3 of 145 frames shown (1 of 3)]
[frame 73/145]
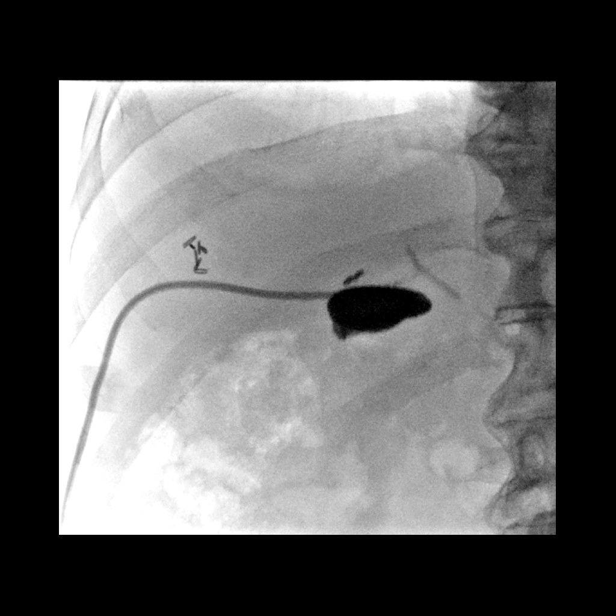
[frame 93/145]
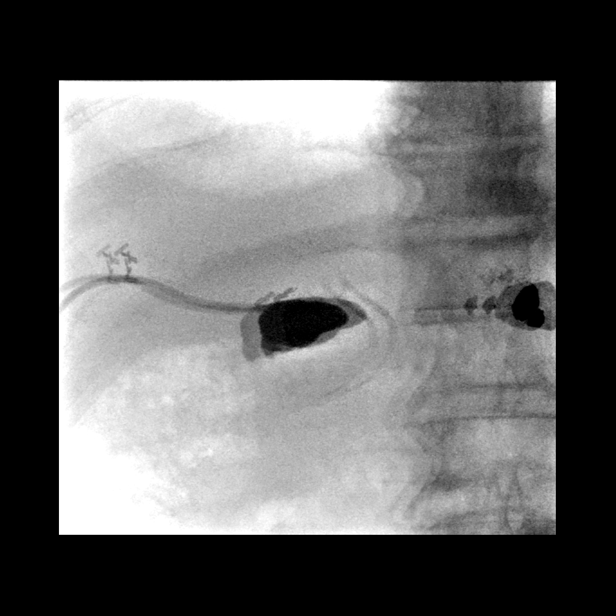
[frame 124/145]
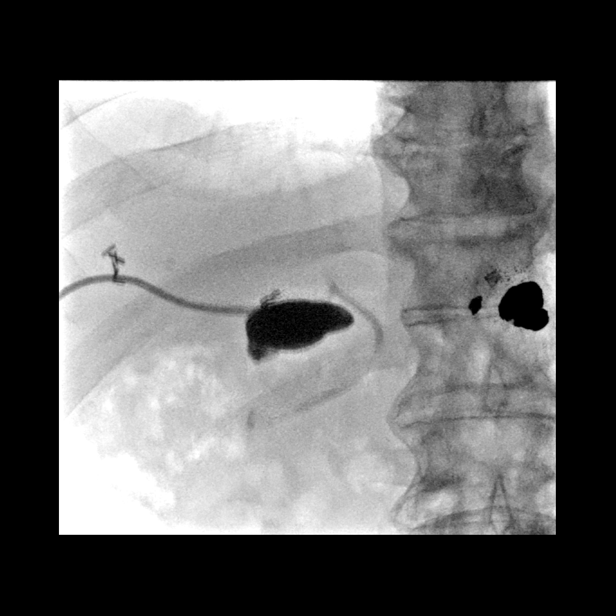

[Series 3: sequence · 3 of 37 frames shown (2 of 3)]
[frame 19/37]
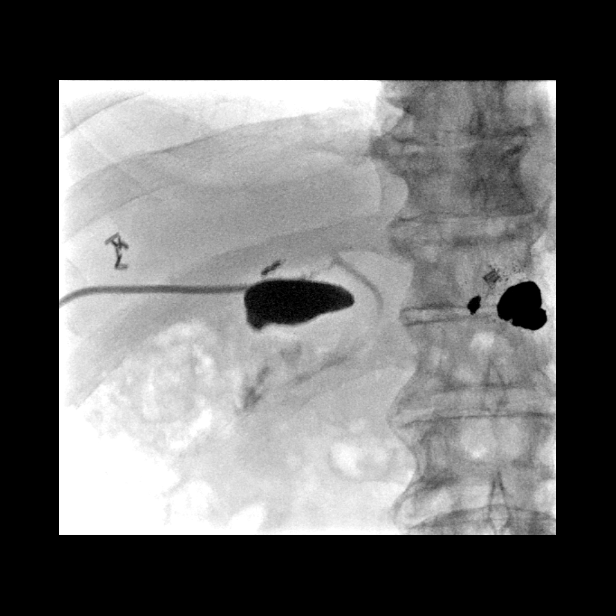
[frame 20/37]
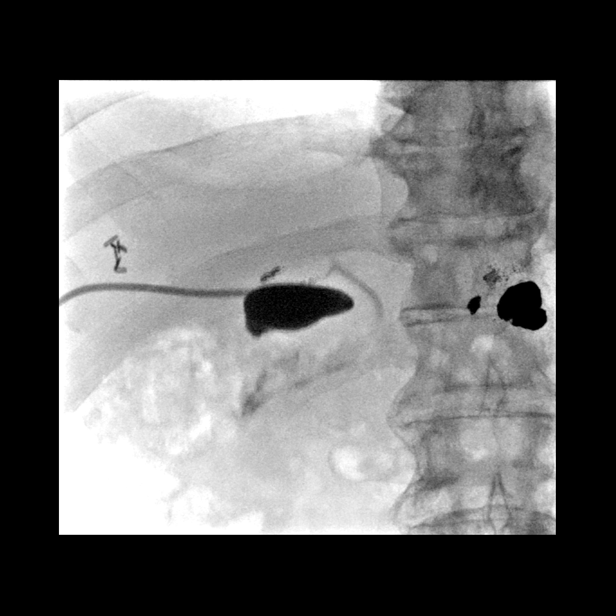
[frame 32/37]
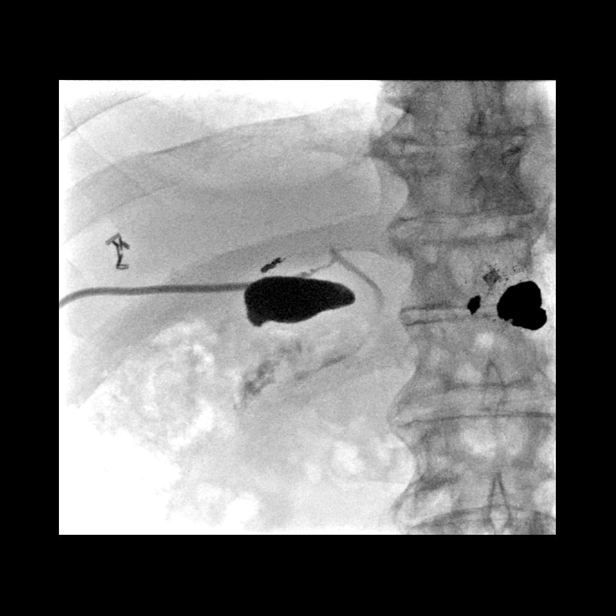

[Series 4: one shot · 0.15mm/px · 3 of 5 slices shown (2 of 3)]
[im 2/5]
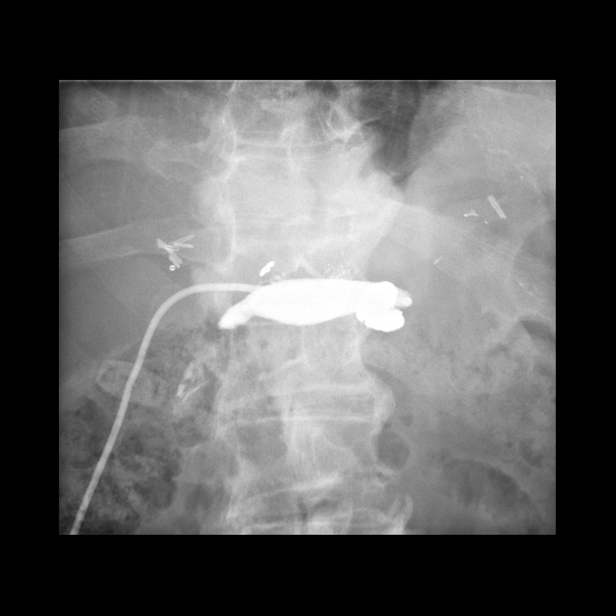
[im 3/5]
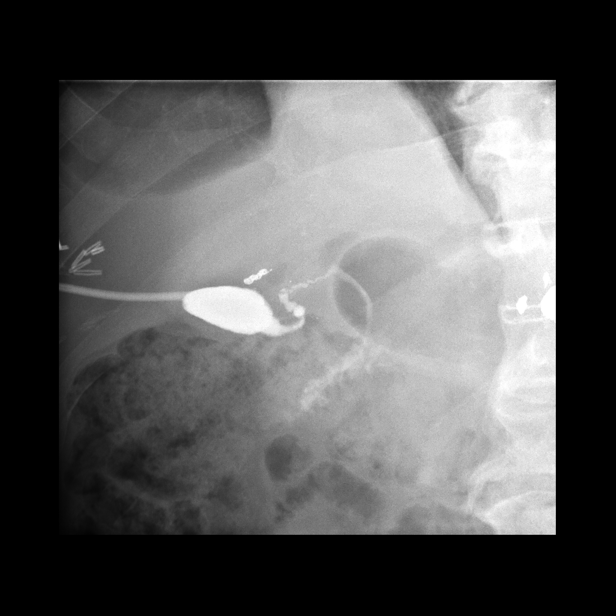
[im 4/5]
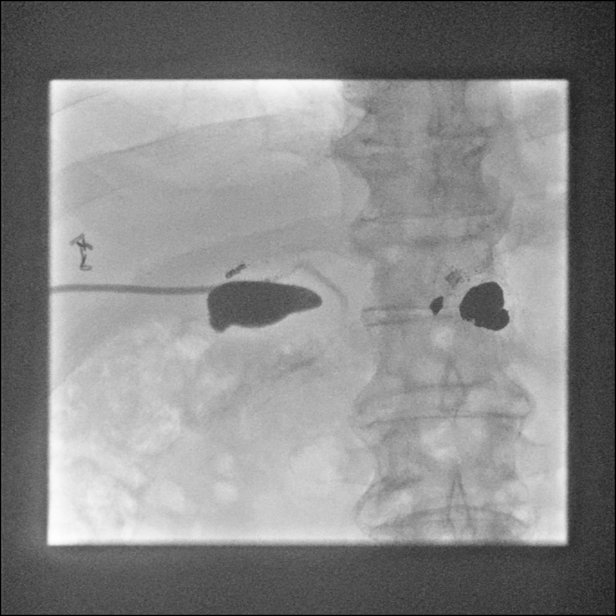

[Series 5: sequence · 3 of 70 frames shown (3 of 3)]
[frame 11/70]
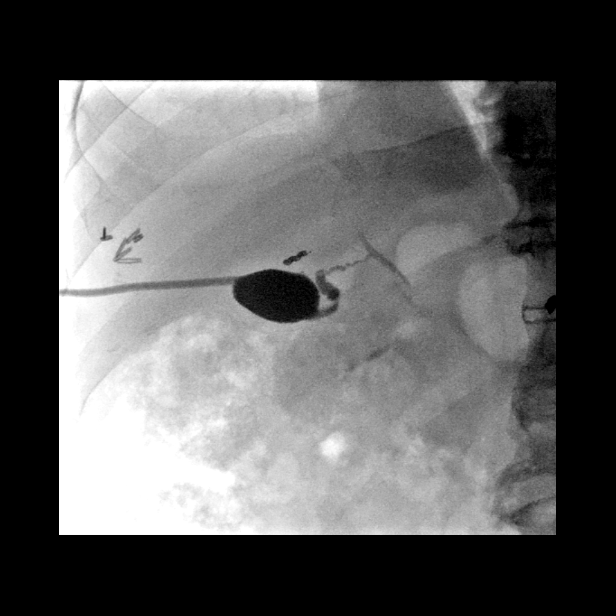
[frame 36/70]
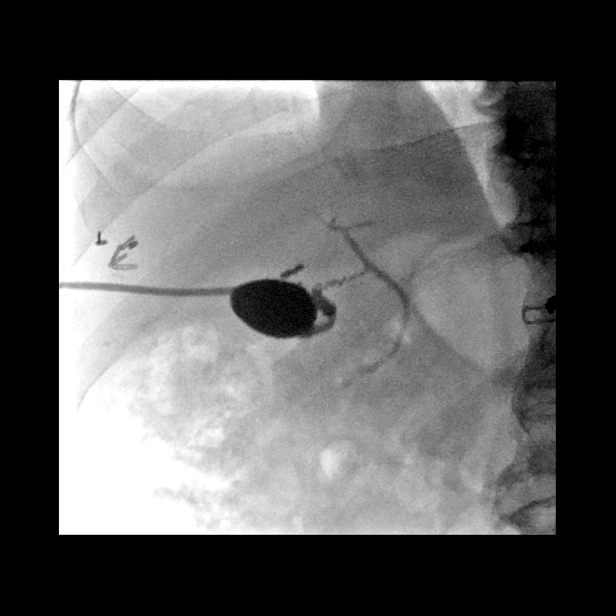
[frame 51/70]
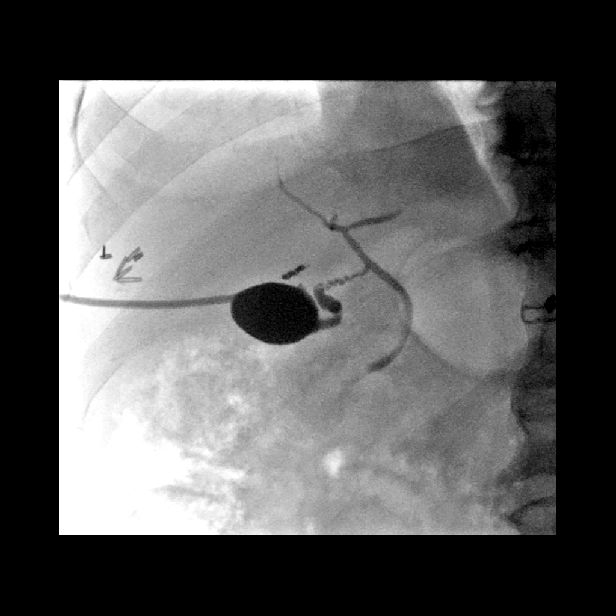

[Series 6: one shot · 0.15mm/px · 1 of 1 slices shown (3 of 3)]
[im 1/1]
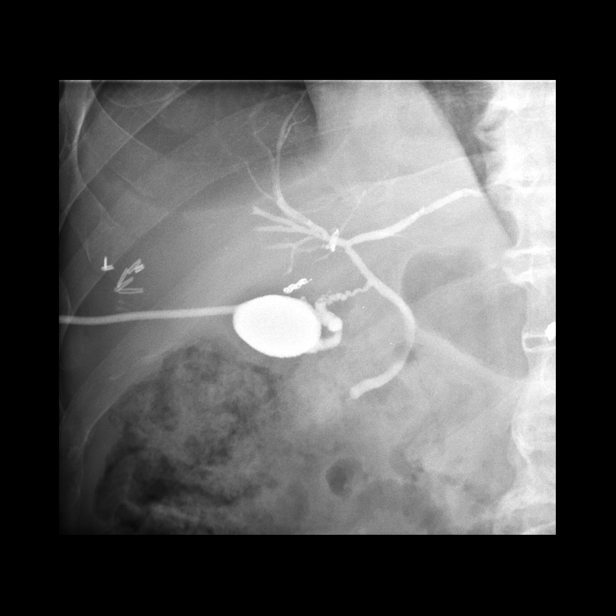

[14 of 19 positions shown; findings below may reference images not displayed]

EXAM:
CHOLANGIOGRAM THROUGH PRE-EXISTING CHOLECYSTOSTOMY TUBE UNDER
FLUOROSCOPY

MEDICATIONS:
None

ANESTHESIA/SEDATION:
None

CONTRAST:  30 mL Omnipaque 300

FLUOROSCOPY TIME:  Fluoroscopy Time: 1 minutes and 36 seconds.
mGy.

COMPLICATIONS:
None immediate.

PROCEDURE:
The pre-existing cholecystostomy tube was injected with contrast
material under fluoroscopy. Multiple spot images and cine loops were
saved. The drain was flushed and capped.
FINDINGS: Injection of the cholecystostomy tube demonstrates a completely
decompressed gallbladder lumen. Contrast drains via a patent cystic
duct and enters a patent common bile duct with drainage visualized
into the duodenum. No evidence of biliary ductal dilatation or
filling defects. Visualized intrahepatic ducts are unremarkable.
IMPRESSION: Normally patent cystic duct and common bile duct with decompressed
gallbladder lumen. The cholecystostomy tube was capped for a trial
of internal drainage.

## 2023-01-15 DIAGNOSIS — E1140 Type 2 diabetes mellitus with diabetic neuropathy, unspecified: Secondary | ICD-10-CM | POA: Diagnosis not present

## 2023-01-15 DIAGNOSIS — I10 Essential (primary) hypertension: Secondary | ICD-10-CM | POA: Diagnosis not present

## 2023-01-15 DIAGNOSIS — Z8739 Personal history of other diseases of the musculoskeletal system and connective tissue: Secondary | ICD-10-CM | POA: Diagnosis not present

## 2023-01-15 DIAGNOSIS — Z9081 Acquired absence of spleen: Secondary | ICD-10-CM | POA: Diagnosis not present

## 2023-01-15 DIAGNOSIS — E782 Mixed hyperlipidemia: Secondary | ICD-10-CM | POA: Diagnosis not present

## 2023-01-15 DIAGNOSIS — D473 Essential (hemorrhagic) thrombocythemia: Secondary | ICD-10-CM | POA: Diagnosis not present

## 2023-01-15 DIAGNOSIS — N529 Male erectile dysfunction, unspecified: Secondary | ICD-10-CM | POA: Diagnosis not present

## 2023-01-15 DIAGNOSIS — M795 Residual foreign body in soft tissue: Secondary | ICD-10-CM | POA: Diagnosis not present

## 2023-01-15 DIAGNOSIS — G720 Drug-induced myopathy: Secondary | ICD-10-CM | POA: Diagnosis not present

## 2023-01-15 DIAGNOSIS — M545 Low back pain, unspecified: Secondary | ICD-10-CM | POA: Diagnosis not present

## 2023-01-15 DIAGNOSIS — M1990 Unspecified osteoarthritis, unspecified site: Secondary | ICD-10-CM | POA: Diagnosis not present

## 2023-03-06 ENCOUNTER — Other Ambulatory Visit (HOSPITAL_COMMUNITY): Payer: Self-pay

## 2023-07-11 DIAGNOSIS — H53143 Visual discomfort, bilateral: Secondary | ICD-10-CM | POA: Diagnosis not present

## 2023-07-11 DIAGNOSIS — H43393 Other vitreous opacities, bilateral: Secondary | ICD-10-CM | POA: Diagnosis not present

## 2023-07-11 DIAGNOSIS — E113293 Type 2 diabetes mellitus with mild nonproliferative diabetic retinopathy without macular edema, bilateral: Secondary | ICD-10-CM | POA: Diagnosis not present

## 2023-08-30 DIAGNOSIS — L239 Allergic contact dermatitis, unspecified cause: Secondary | ICD-10-CM | POA: Diagnosis not present

## 2023-08-30 DIAGNOSIS — H579 Unspecified disorder of eye and adnexa: Secondary | ICD-10-CM | POA: Diagnosis not present

## 2024-02-04 DIAGNOSIS — E782 Mixed hyperlipidemia: Secondary | ICD-10-CM | POA: Diagnosis not present

## 2024-02-04 DIAGNOSIS — Z8739 Personal history of other diseases of the musculoskeletal system and connective tissue: Secondary | ICD-10-CM | POA: Diagnosis not present

## 2024-02-04 DIAGNOSIS — M795 Residual foreign body in soft tissue: Secondary | ICD-10-CM | POA: Diagnosis not present

## 2024-02-04 DIAGNOSIS — E1140 Type 2 diabetes mellitus with diabetic neuropathy, unspecified: Secondary | ICD-10-CM | POA: Diagnosis not present

## 2024-02-04 DIAGNOSIS — M1990 Unspecified osteoarthritis, unspecified site: Secondary | ICD-10-CM | POA: Diagnosis not present

## 2024-02-04 DIAGNOSIS — I10 Essential (primary) hypertension: Secondary | ICD-10-CM | POA: Diagnosis not present

## 2024-02-04 DIAGNOSIS — M545 Low back pain, unspecified: Secondary | ICD-10-CM | POA: Diagnosis not present

## 2024-02-04 DIAGNOSIS — N529 Male erectile dysfunction, unspecified: Secondary | ICD-10-CM | POA: Diagnosis not present

## 2024-02-04 DIAGNOSIS — D473 Essential (hemorrhagic) thrombocythemia: Secondary | ICD-10-CM | POA: Diagnosis not present

## 2024-02-04 DIAGNOSIS — Z9081 Acquired absence of spleen: Secondary | ICD-10-CM | POA: Diagnosis not present

## 2024-02-04 DIAGNOSIS — G720 Drug-induced myopathy: Secondary | ICD-10-CM | POA: Diagnosis not present

## 2024-02-04 DIAGNOSIS — H538 Other visual disturbances: Secondary | ICD-10-CM | POA: Diagnosis not present

## 2024-02-13 DIAGNOSIS — D128 Benign neoplasm of rectum: Secondary | ICD-10-CM | POA: Diagnosis not present

## 2024-02-13 DIAGNOSIS — Z09 Encounter for follow-up examination after completed treatment for conditions other than malignant neoplasm: Secondary | ICD-10-CM | POA: Diagnosis not present

## 2024-02-13 DIAGNOSIS — D120 Benign neoplasm of cecum: Secondary | ICD-10-CM | POA: Diagnosis not present

## 2024-02-13 DIAGNOSIS — K635 Polyp of colon: Secondary | ICD-10-CM | POA: Diagnosis not present

## 2024-02-13 DIAGNOSIS — K6389 Other specified diseases of intestine: Secondary | ICD-10-CM | POA: Diagnosis not present

## 2024-02-13 DIAGNOSIS — K648 Other hemorrhoids: Secondary | ICD-10-CM | POA: Diagnosis not present

## 2024-02-13 DIAGNOSIS — Z800 Family history of malignant neoplasm of digestive organs: Secondary | ICD-10-CM | POA: Diagnosis not present

## 2024-02-13 DIAGNOSIS — Z860101 Personal history of adenomatous and serrated colon polyps: Secondary | ICD-10-CM | POA: Diagnosis not present
